# Patient Record
Sex: Female | Born: 1975 | Hispanic: Yes | Marital: Married | Smoking: Former smoker
Health system: Southern US, Community
[De-identification: ages and names within clinical notes are randomized; demographics above are authoritative.]

## PROBLEM LIST (undated history)

## (undated) DIAGNOSIS — E119 Type 2 diabetes mellitus without complications: Secondary | ICD-10-CM

## (undated) HISTORY — DX: Type 2 diabetes mellitus without complications: E11.9

## (undated) HISTORY — PX: BREAST SURGERY: SHX581

---

## 2013-02-28 ENCOUNTER — Encounter: Payer: Self-pay | Admitting: Obstetrics and Gynecology

## 2013-02-28 ENCOUNTER — Ambulatory Visit (INDEPENDENT_AMBULATORY_CARE_PROVIDER_SITE_OTHER): Payer: BC Managed Care – PPO | Admitting: Obstetrics and Gynecology

## 2013-02-28 VITALS — BP 125/84 | HR 82 | Ht 63.0 in | Wt 166.0 lb

## 2013-02-28 DIAGNOSIS — Z01419 Encounter for gynecological examination (general) (routine) without abnormal findings: Secondary | ICD-10-CM

## 2013-02-28 DIAGNOSIS — Z1151 Encounter for screening for human papillomavirus (HPV): Secondary | ICD-10-CM

## 2013-02-28 DIAGNOSIS — Z124 Encounter for screening for malignant neoplasm of cervix: Secondary | ICD-10-CM

## 2013-02-28 LAB — GLUCOSE, RANDOM: Glucose, Bld: 77 mg/dL (ref 70–99)

## 2013-02-28 LAB — LIPID PANEL: Total CHOL/HDL Ratio: 3.2 Ratio

## 2013-02-28 NOTE — Patient Instructions (Signed)
Preventive Care for Adults, Female A healthy lifestyle and preventive care can promote health and wellness. Preventive health guidelines for women include the following key practices.  A routine yearly physical is a good way to check with your caregiver about your health and preventive screening. It is a chance to share any concerns and updates on your health, and to receive a thorough exam.  Visit your dentist for a routine exam and preventive care every 6 months. Brush your teeth twice a day and floss once a day. Good oral hygiene prevents tooth decay and gum disease.  The frequency of eye exams is based on your age, health, family medical history, use of contact lenses, and other factors. Follow your caregiver's recommendations for frequency of eye exams.  Eat a healthy diet. Foods like vegetables, fruits, whole grains, low-fat dairy products, and lean protein foods contain the nutrients you need without too many calories. Decrease your intake of foods high in solid fats, added sugars, and salt. Eat the right amount of calories for you.Get information about a proper diet from your caregiver, if necessary.  Regular physical exercise is one of the most important things you can do for your health. Most adults should get at least 150 minutes of moderate-intensity exercise (any activity that increases your heart rate and causes you to sweat) each week. In addition, most adults need muscle-strengthening exercises on 2 or more days a week.  Maintain a healthy weight. The body mass index (BMI) is a screening tool to identify possible weight problems. It provides an estimate of body fat based on height and weight. Your caregiver can help determine your BMI, and can help you achieve or maintain a healthy weight.For adults 20 years and older:  A BMI below 18.5 is considered underweight.  A BMI of 18.5 to 24.9 is normal.  A BMI of 25 to 29.9 is considered overweight.  A BMI of 30 and above is  considered obese.  Maintain normal blood lipids and cholesterol levels by exercising and minimizing your intake of saturated fat. Eat a balanced diet with plenty of fruit and vegetables. Blood tests for lipids and cholesterol should begin at age 20 and be repeated every 5 years. If your lipid or cholesterol levels are high, you are over 50, or you are at high risk for heart disease, you may need your cholesterol levels checked more frequently.Ongoing high lipid and cholesterol levels should be treated with medicines if diet and exercise are not effective.  If you smoke, find out from your caregiver how to quit. If you do not use tobacco, do not start.  If you are pregnant, do not drink alcohol. If you are breastfeeding, be very cautious about drinking alcohol. If you are not pregnant and choose to drink alcohol, do not exceed 1 drink per day. One drink is considered to be 12 ounces (355 mL) of beer, 5 ounces (148 mL) of wine, or 1.5 ounces (44 mL) of liquor.  Avoid use of street drugs. Do not share needles with anyone. Ask for help if you need support or instructions about stopping the use of drugs.  High blood pressure causes heart disease and increases the risk of stroke. Your blood pressure should be checked at least every 1 to 2 years. Ongoing high blood pressure should be treated with medicines if weight loss and exercise are not effective.  If you are 55 to 36 years old, ask your caregiver if you should take aspirin to prevent strokes.  Diabetes   screening involves taking a blood sample to check your fasting blood sugar level. This should be done once every 3 years, after age 45, if you are within normal weight and without risk factors for diabetes. Testing should be considered at a younger age or be carried out more frequently if you are overweight and have at least 1 risk factor for diabetes.  Breast cancer screening is essential preventive care for women. You should practice "breast  self-awareness." This means understanding the normal appearance and feel of your breasts and may include breast self-examination. Any changes detected, no matter how small, should be reported to a caregiver. Women in their 20s and 30s should have a clinical breast exam (CBE) by a caregiver as part of a regular health exam every 1 to 3 years. After age 40, women should have a CBE every year. Starting at age 40, women should consider having a mammography (breast X-ray test) every year. Women who have a family history of breast cancer should talk to their caregiver about genetic screening. Women at a high risk of breast cancer should talk to their caregivers about having magnetic resonance imaging (MRI) and a mammography every year.  The Pap test is a screening test for cervical cancer. A Pap test can show cell changes on the cervix that might become cervical cancer if left untreated. A Pap test is a procedure in which cells are obtained and examined from the lower end of the uterus (cervix).  Women should have a Pap test starting at age 21.  Between ages 21 and 29, Pap tests should be repeated every 2 years.  Beginning at age 30, you should have a Pap test every 3 years as long as the past 3 Pap tests have been normal.  Some women have medical problems that increase the chance of getting cervical cancer. Talk to your caregiver about these problems. It is especially important to talk to your caregiver if a new problem develops soon after your last Pap test. In these cases, your caregiver may recommend more frequent screening and Pap tests.  The above recommendations are the same for women who have or have not gotten the vaccine for human papillomavirus (HPV).  If you had a hysterectomy for a problem that was not cancer or a condition that could lead to cancer, then you no longer need Pap tests. Even if you no longer need a Pap test, a regular exam is a good idea to make sure no other problems are  starting.  If you are between ages 65 and 70, and you have had normal Pap tests going back 10 years, you no longer need Pap tests. Even if you no longer need a Pap test, a regular exam is a good idea to make sure no other problems are starting.  If you have had past treatment for cervical cancer or a condition that could lead to cancer, you need Pap tests and screening for cancer for at least 20 years after your treatment.  If Pap tests have been discontinued, risk factors (such as a new sexual partner) need to be reassessed to determine if screening should be resumed.  The HPV test is an additional test that may be used for cervical cancer screening. The HPV test looks for the virus that can cause the cell changes on the cervix. The cells collected during the Pap test can be tested for HPV. The HPV test could be used to screen women aged 30 years and older, and should   be used in women of any age who have unclear Pap test results. After the age of 30, women should have HPV testing at the same frequency as a Pap test.  Colorectal cancer can be detected and often prevented. Most routine colorectal cancer screening begins at the age of 50 and continues through age 75. However, your caregiver may recommend screening at an earlier age if you have risk factors for colon cancer. On a yearly basis, your caregiver may provide home test kits to check for hidden blood in the stool. Use of a small camera at the end of a tube, to directly examine the colon (sigmoidoscopy or colonoscopy), can detect the earliest forms of colorectal cancer. Talk to your caregiver about this at age 50, when routine screening begins. Direct examination of the colon should be repeated every 5 to 10 years through age 75, unless early forms of pre-cancerous polyps or small growths are found.  Hepatitis C blood testing is recommended for all people born from 1945 through 1965 and any individual with known risks for hepatitis C.  Practice  safe sex. Use condoms and avoid high-risk sexual practices to reduce the spread of sexually transmitted infections (STIs). STIs include gonorrhea, chlamydia, syphilis, trichomonas, herpes, HPV, and human immunodeficiency virus (HIV). Herpes, HIV, and HPV are viral illnesses that have no cure. They can result in disability, cancer, and death. Sexually active women aged 25 and younger should be checked for chlamydia. Older women with new or multiple partners should also be tested for chlamydia. Testing for other STIs is recommended if you are sexually active and at increased risk.  Osteoporosis is a disease in which the bones lose minerals and strength with aging. This can result in serious bone fractures. The risk of osteoporosis can be identified using a bone density scan. Women ages 65 and over and women at risk for fractures or osteoporosis should discuss screening with their caregivers. Ask your caregiver whether you should take a calcium supplement or vitamin D to reduce the rate of osteoporosis.  Menopause can be associated with physical symptoms and risks. Hormone replacement therapy is available to decrease symptoms and risks. You should talk to your caregiver about whether hormone replacement therapy is right for you.  Use sunscreen with sun protection factor (SPF) of 30 or more. Apply sunscreen liberally and repeatedly throughout the day. You should seek shade when your shadow is shorter than you. Protect yourself by wearing long sleeves, pants, a wide-brimmed hat, and sunglasses year round, whenever you are outdoors.  Once a month, do a whole body skin exam, using a mirror to look at the skin on your back. Notify your caregiver of new moles, moles that have irregular borders, moles that are larger than a pencil eraser, or moles that have changed in shape or color.  Stay current with required immunizations.  Influenza. You need a dose every fall (or winter). The composition of the flu vaccine  changes each year, so being vaccinated once is not enough.  Pneumococcal polysaccharide. You need 1 to 2 doses if you smoke cigarettes or if you have certain chronic medical conditions. You need 1 dose at age 65 (or older) if you have never been vaccinated.  Tetanus, diphtheria, pertussis (Tdap, Td). Get 1 dose of Tdap vaccine if you are younger than age 65, are over 65 and have contact with an infant, are a healthcare worker, are pregnant, or simply want to be protected from whooping cough. After that, you need a Td   booster dose every 10 years. Consult your caregiver if you have not had at least 3 tetanus and diphtheria-containing shots sometime in your life or have a deep or dirty wound.  HPV. You need this vaccine if you are a woman age 26 or younger. The vaccine is given in 3 doses over 6 months.  Measles, mumps, rubella (MMR). You need at least 1 dose of MMR if you were born in 1957 or later. You may also need a second dose.  Meningococcal. If you are age 19 to 21 and a first-year college student living in a residence hall, or have one of several medical conditions, you need to get vaccinated against meningococcal disease. You may also need additional booster doses.  Zoster (shingles). If you are age 60 or older, you should get this vaccine.  Varicella (chickenpox). If you have never had chickenpox or you were vaccinated but received only 1 dose, talk to your caregiver to find out if you need this vaccine.  Hepatitis A. You need this vaccine if you have a specific risk factor for hepatitis A virus infection or you simply wish to be protected from this disease. The vaccine is usually given as 2 doses, 6 to 18 months apart.  Hepatitis B. You need this vaccine if you have a specific risk factor for hepatitis B virus infection or you simply wish to be protected from this disease. The vaccine is given in 3 doses, usually over 6 months. Preventive Services / Frequency Ages 19 to 39  Blood  pressure check.** / Every 1 to 2 years.  Lipid and cholesterol check.** / Every 5 years beginning at age 20.  Clinical breast exam.** / Every 3 years for women in their 20s and 30s.  Pap test.** / Every 2 years from ages 21 through 29. Every 3 years starting at age 30 through age 65 or 70 with a history of 3 consecutive normal Pap tests.  HPV screening.** / Every 3 years from ages 30 through ages 65 to 70 with a history of 3 consecutive normal Pap tests.  Hepatitis C blood test.** / For any individual with known risks for hepatitis C.  Skin self-exam. / Monthly.  Influenza immunization.** / Every year.  Pneumococcal polysaccharide immunization.** / 1 to 2 doses if you smoke cigarettes or if you have certain chronic medical conditions.  Tetanus, diphtheria, pertussis (Tdap, Td) immunization. / A one-time dose of Tdap vaccine. After that, you need a Td booster dose every 10 years.  HPV immunization. / 3 doses over 6 months, if you are 26 and younger.  Measles, mumps, rubella (MMR) immunization. / You need at least 1 dose of MMR if you were born in 1957 or later. You may also need a second dose.  Meningococcal immunization. / 1 dose if you are age 19 to 21 and a first-year college student living in a residence hall, or have one of several medical conditions, you need to get vaccinated against meningococcal disease. You may also need additional booster doses.  Varicella immunization.** / Consult your caregiver.  Hepatitis A immunization.** / Consult your caregiver. 2 doses, 6 to 18 months apart.  Hepatitis B immunization.** / Consult your caregiver. 3 doses usually over 6 months. Ages 40 to 64  Blood pressure check.** / Every 1 to 2 years.  Lipid and cholesterol check.** / Every 5 years beginning at age 20.  Clinical breast exam.** / Every year after age 40.  Mammogram.** / Every year beginning at age 40   and continuing for as long as you are in good health. Consult with your  caregiver.  Pap test.** / Every 3 years starting at age 30 through age 65 or 70 with a history of 3 consecutive normal Pap tests.  HPV screening.** / Every 3 years from ages 30 through ages 65 to 70 with a history of 3 consecutive normal Pap tests.  Fecal occult blood test (FOBT) of stool. / Every year beginning at age 50 and continuing until age 75. You may not need to do this test if you get a colonoscopy every 10 years.  Flexible sigmoidoscopy or colonoscopy.** / Every 5 years for a flexible sigmoidoscopy or every 10 years for a colonoscopy beginning at age 50 and continuing until age 75.  Hepatitis C blood test.** / For all people born from 1945 through 1965 and any individual with known risks for hepatitis C.  Skin self-exam. / Monthly.  Influenza immunization.** / Every year.  Pneumococcal polysaccharide immunization.** / 1 to 2 doses if you smoke cigarettes or if you have certain chronic medical conditions.  Tetanus, diphtheria, pertussis (Tdap, Td) immunization.** / A one-time dose of Tdap vaccine. After that, you need a Td booster dose every 10 years.  Measles, mumps, rubella (MMR) immunization. / You need at least 1 dose of MMR if you were born in 1957 or later. You may also need a second dose.  Varicella immunization.** / Consult your caregiver.  Meningococcal immunization.** / Consult your caregiver.  Hepatitis A immunization.** / Consult your caregiver. 2 doses, 6 to 18 months apart.  Hepatitis B immunization.** / Consult your caregiver. 3 doses, usually over 6 months. Ages 65 and over  Blood pressure check.** / Every 1 to 2 years.  Lipid and cholesterol check.** / Every 5 years beginning at age 20.  Clinical breast exam.** / Every year after age 40.  Mammogram.** / Every year beginning at age 40 and continuing for as long as you are in good health. Consult with your caregiver.  Pap test.** / Every 3 years starting at age 30 through age 65 or 70 with a 3  consecutive normal Pap tests. Testing can be stopped between 65 and 70 with 3 consecutive normal Pap tests and no abnormal Pap or HPV tests in the past 10 years.  HPV screening.** / Every 3 years from ages 30 through ages 65 or 70 with a history of 3 consecutive normal Pap tests. Testing can be stopped between 65 and 70 with 3 consecutive normal Pap tests and no abnormal Pap or HPV tests in the past 10 years.  Fecal occult blood test (FOBT) of stool. / Every year beginning at age 50 and continuing until age 75. You may not need to do this test if you get a colonoscopy every 10 years.  Flexible sigmoidoscopy or colonoscopy.** / Every 5 years for a flexible sigmoidoscopy or every 10 years for a colonoscopy beginning at age 50 and continuing until age 75.  Hepatitis C blood test.** / For all people born from 1945 through 1965 and any individual with known risks for hepatitis C.  Osteoporosis screening.** / A one-time screening for women ages 65 and over and women at risk for fractures or osteoporosis.  Skin self-exam. / Monthly.  Influenza immunization.** / Every year.  Pneumococcal polysaccharide immunization.** / 1 dose at age 65 (or older) if you have never been vaccinated.  Tetanus, diphtheria, pertussis (Tdap, Td) immunization. / A one-time dose of Tdap vaccine if you are over   65 and have contact with an infant, are a healthcare worker, or simply want to be protected from whooping cough. After that, you need a Td booster dose every 10 years.  Varicella immunization.** / Consult your caregiver.  Meningococcal immunization.** / Consult your caregiver.  Hepatitis A immunization.** / Consult your caregiver. 2 doses, 6 to 18 months apart.  Hepatitis B immunization.** / Check with your caregiver. 3 doses, usually over 6 months. ** Family history and personal history of risk and conditions may change your caregiver's recommendations. Document Released: 06/13/2001 Document Revised: 07/10/2011  Document Reviewed: 09/12/2010 [employer] Patient Information 2014 [employer].  

## 2013-02-28 NOTE — Progress Notes (Signed)
  Subjective:     Deborah Alexander is a 37 y.o. female with LMP 02/13/2013 and BMI 29 who is here for a comprehensive physical exam. The patient reports no problems. She is sexually active using rhythm method for contraception. She would welcome a pregnancy if it occurred. Patient reports normal monthly menstrual cycles  History   Social History  . Marital Status: Married    Spouse Name: N/A    Number of Children: N/A  . Years of Education: N/A   Occupational History  . Not on file.   Social History Main Topics  . Smoking status: Not on file  . Smokeless tobacco: Not on file  . Alcohol Use: Not on file  . Drug Use: Not on file  . Sexual Activity: Not on file   Other Topics Concern  . Not on file   Social History Narrative  . No narrative on file   No health maintenance topics applied.  History reviewed. No pertinent past medical history. Past Surgical History  Procedure Laterality Date  . Breast surgery  1997-1998   Family History  Problem Relation Age of Onset  . Hypertension Mother   . Heart disease Mother   . Hypertension Father   . Diabetes Father   . Heart disease Father    History   Social History  . Marital Status: Married    Spouse Name: N/A    Number of Children: N/A  . Years of Education: N/A   Occupational History  . Not on file.   Social History Main Topics  . Smoking status: Current Every Day Smoker  . Smokeless tobacco: Never Used  . Alcohol Use: Yes     Comment: weekends  . Drug Use: No  . Sexual Activity: Yes    Partners: Male    Birth Control/ Protection: None   Other Topics Concern  . Not on file   Social History Narrative  . No narrative on file     Review of Systems A comprehensive review of systems was negative.   Objective:      GENERAL: Well-developed, well-nourished female in no acute distress.  HEENT: Normocephalic, atraumatic. Sclerae anicteric.  NECK: Supple. Normal thyroid.  LUNGS: Clear to auscultation  bilaterally.  HEART: Regular rate and rhythm. BREASTS: Symmetric in size. No palpable masses or lymphadenopathy, skin changes, or nipple drainage. ABDOMEN: Soft, nontender, nondistended. No organomegaly. PELVIC: Normal external female genitalia. Vagina is pink and rugated.  Normal discharge. Normal appearing cervix. Uterus is normal in size. No adnexal mass or tenderness. EXTREMITIES: No cyanosis, clubbing, or edema, 2+ distal pulses.    Assessment:    Healthy female exam.      Plan:    Pap smear collected Patient advised to take PNV in the event that a pregnancy occurs Patient advised to perform monthly breast exam Fasting labs collected today See After Visit Summary for Counseling Recommendations

## 2013-03-01 LAB — TSH: TSH: 0.802 u[IU]/mL (ref 0.350–4.500)

## 2014-03-02 ENCOUNTER — Ambulatory Visit (INDEPENDENT_AMBULATORY_CARE_PROVIDER_SITE_OTHER): Payer: BC Managed Care – PPO | Admitting: Obstetrics & Gynecology

## 2014-03-02 ENCOUNTER — Encounter: Payer: Self-pay | Admitting: Obstetrics & Gynecology

## 2014-03-02 VITALS — BP 121/84 | HR 93 | Ht 63.0 in | Wt 184.6 lb

## 2014-03-02 DIAGNOSIS — Z01419 Encounter for gynecological examination (general) (routine) without abnormal findings: Secondary | ICD-10-CM

## 2014-03-02 DIAGNOSIS — Z1151 Encounter for screening for human papillomavirus (HPV): Secondary | ICD-10-CM

## 2014-03-02 DIAGNOSIS — Z124 Encounter for screening for malignant neoplasm of cervix: Secondary | ICD-10-CM | POA: Diagnosis not present

## 2014-03-02 NOTE — Patient Instructions (Signed)
Pap Test A Pap test is a procedure done in a clinic office to evaluate cells that are on the surface of the cervix. The cervix is the lower portion of the uterus and upper portion of the vagina. For some women, the cervical region has the potential to form cancer. With consistent evaluations by your caregiver, this type of cancer can be prevented.  If a Pap test is abnormal, it is most often a result of a previous exposure to human papillomavirus (HPV). HPV is a virus that can infect the cells of the cervix and cause dysplasia. Dysplasia is where the cells no longer look normal. If a woman has been diagnosed with high-grade or severe dysplasia, they are at higher risk of developing cervical cancer. People diagnosed with low-grade dysplasia should still be seen by their caregiver because there is a small chance that low-grade dysplasia could develop into cancer.  LET YOUR CAREGIVER KNOW ABOUT:  Recent sexually transmitted infection (STI) you have had.  Any new sex partners you have had.  History of previous abnormal Pap tests results.  History of previous cervical procedures you have had (colposcopy, biopsy, loop electrosurgical excision procedure [LEEP]).  Concerns you have had regarding unusual vaginal discharge.  History of pelvic pain.  Your use of birth control. BEFORE THE PROCEDURE  Ask your caregiver when to schedule your Pap test. It is best not to be on your period if your caregiver uses a wooden spatula to collect cells or applies cells to a glass slide. Newer techniques are not so sensitive to the timing of a menstrual cycle.  Do not douche or have sexual intercourse for 24 hours before the test.   Do not use vaginal creams or tampons for 24 hours before the test.   Empty your bladder just before the test to lessen any discomfort.  PROCEDURE You will lie on an exam table with your feet in stirrups. A warm metal or plastic instrument (speculum) is placed in your vagina. This  instrument allows your caregiver to see the inside of your vagina and look at your cervix. A small, plastic brush or wooden spatula is then used to collect cervical cells. These cells are placed in a lab specimen container. The cells are looked at under a microscope. A specialist will determine if the cells are normal.  AFTER THE PROCEDURE Make sure to get your test results.If your results come back abnormal, you may need further testing.  Document Released: 07/08/2002 Document Revised: 07/10/2011 Document Reviewed: 04/13/2011 [employer] Patient Information 2015 [employer]. This information is not intended to replace advice given to you by your health care provider. Make sure you discuss any questions you have with your health care provider.  

## 2014-03-02 NOTE — Progress Notes (Signed)
Patient ID: Deborah Alexander, female   DOB: January 05, 1976, 38 y.o.   MRN: [id number] Subjective:     Deborah Alexander is a 38 y.o. female here for a routine exam.  Current complaints: none.   Gynecologic History Patient's last menstrual period was 02/17/2014 (exact date). Contraception: none Last Pap: 2014. Results were: normal Last mammogram: n/a.   History reviewed. No pertinent past medical history. Past Surgical History  Procedure Laterality Date  . Breast surgery  1997-1998   No current outpatient prescriptions on file prior to visit.   No current facility-administered medications on file prior to visit.   No Known Allergies    Obstetric History OB History  Gravida Para Term Preterm AB SAB TAB Ectopic Multiple Living  2    2 1 1        # Outcome Date GA Lbr Len/2nd Weight Sex Delivery Anes PTL Lv  2 SAB           1 TAB                The following portions of the patient's history were reviewed and updated as appropriate: allergies, current medications, past family history, past medical history, past social history, past surgical history and problem list.  Review of Systems A comprehensive review of systems was negative.    Objective:    BP 121/84 mmHg  Pulse 93  Ht 5\' 3"  (1.6 m)  Wt 184 lb 9.6 oz (83.734 kg)  BMI 32.71 kg/m2  LMP 02/17/2014 (Exact Date)  General Appearance:    Alert, cooperative, no distress, appears stated age              Throat:   Lips, mucosa, and tongue normal; teeth and gums normal  Neck:   Supple, symmetrical, trachea midline, no adenopathy;    thyroid:  no enlargement/tenderness/nodules; no carotid   bruit or JVD  Back:     Symmetric, no curvature, ROM normal, no CVA tenderness  Lungs:     Clear to auscultation bilaterally, respirations unlabored  Chest Wall:    No tenderness or deformity   Heart:    Regular rate and rhythm, S1 and S2 normal, no murmur, rub   or gallop  Breast Exam:    No tenderness, masses, or nipple abnormality  Abdomen:      Soft, non-tender, bowel sounds active all four quadrants,    no masses, no organomegaly  Genitalia:    Normal female without lesion, discharge or tenderness     Extremities:   Extremities normal, atraumatic, no cyanosis or edema                  Assessment:    Healthy female exam.    Plan:    Follow up in: 1 year.    F/u PAP with HPV Labs: CBC and HgbA1C

## 2014-03-03 ENCOUNTER — Telehealth: Payer: Self-pay | Admitting: *Deleted

## 2014-03-03 LAB — HEMOGLOBIN A1C
Hgb A1c MFr Bld: 5.9 % — ABNORMAL HIGH (ref <5.7)
Mean Plasma Glucose: 123 mg/dL — ABNORMAL HIGH (ref <117)

## 2014-03-03 LAB — CBC
HCT: 38.9 % (ref 36.0–46.0)
Hemoglobin: 13.5 g/dL (ref 12.0–15.0)
MCH: 29.7 pg (ref 26.0–34.0)
MCHC: 34.7 g/dL (ref 30.0–36.0)
MCV: 85.7 fL (ref 78.0–100.0)
Platelets: 338 10*3/uL (ref 150–400)
RBC: 4.54 MIL/uL (ref 3.87–5.11)
RDW: 14.1 % (ref 11.5–15.5)
WBC: 6.3 10*3/uL (ref 4.0–10.5)

## 2014-03-03 NOTE — Telephone Encounter (Signed)
Left message for patient to call us back regarding her lab results.  Patient had requested we fax a copy of her results to her Primary Care Physician.  Dr. Cato MulliganAshany Sundaan.  Phone number is [phone].

## 2014-03-03 NOTE — Telephone Encounter (Signed)
-----   Message from Willodean Rosenthalarolyn Harraway-Smith, MD sent at 03/03/2014 10:36 AM EST ----- Please call pt. Her labs show that she is PREdiabetic, at increased risk of DM.  This result should be forwarded to her primary care provider and please encourage her to f/u with them for further eval and management.  Thx, clh-S

## 2014-03-04 LAB — CYTOLOGY - PAP

## 2014-03-05 ENCOUNTER — Telehealth: Payer: Self-pay | Admitting: *Deleted

## 2014-03-05 NOTE — Telephone Encounter (Signed)
-----   Message from Carolyn Harraway-Smith, MD sent at 03/03/2014 10:36 AM EST ----- Please call pt. Her labs show that she is PREdiabetic, at increased risk of DM.  This result should be forwarded to her primary care provider and please encourage her to f/u with them for further eval and management.  Thx, clh-S    

## 2014-03-05 NOTE — Telephone Encounter (Signed)
Notified patient of results and recommendations.

## 2014-10-14 ENCOUNTER — Ambulatory Visit (INDEPENDENT_AMBULATORY_CARE_PROVIDER_SITE_OTHER): Payer: Federal, State, Local not specified - PPO | Admitting: Physician Assistant

## 2014-10-14 ENCOUNTER — Encounter: Payer: Self-pay | Admitting: Physician Assistant

## 2014-10-14 VITALS — BP 134/85 | HR 105 | Ht 63.0 in | Wt 185.0 lb

## 2014-10-14 DIAGNOSIS — R319 Hematuria, unspecified: Secondary | ICD-10-CM

## 2014-10-14 DIAGNOSIS — N939 Abnormal uterine and vaginal bleeding, unspecified: Secondary | ICD-10-CM | POA: Diagnosis not present

## 2014-10-14 DIAGNOSIS — Z720 Tobacco use: Secondary | ICD-10-CM | POA: Insufficient documentation

## 2014-10-14 NOTE — Progress Notes (Signed)
Patient ID: Deborah Alexander, female   DOB: July 01, 1975, 39 y.o.   MRN: [id number] History:  Deborah Alexander is a 39 y.o. G2P0020 who presents to clinic today for irregular period.  She last had menses May 28 and then started bleeding again 3 days ago.  She went to [facility] and was told she had urine infection and given rx for abx.  She is unsure if she should use.  She does not use any hormones or contraception.  She denies abdominal pain, dysuria,  Increased urinary frequency, back pain, hesitancy.    The following portions of the patient's history were reviewed and updated as appropriate: allergies, current medications, past family history, past medical history, past social history, past surgical history and problem list.  Review of Systems:  Pertinent ROS in HPI.  All other systems are negative.     Objective:  Physical Exam BP 134/85 mmHg  Pulse 105  Ht 5\' 3"  (1.6 m)  Wt 185 lb (83.915 kg)  BMI 32.78 kg/m2  LMP 09/26/2014 (Exact Date) GENERAL: Well-developed, well-nourished female in no acute distress.  HEENT: Normocephalic, atraumatic.  NECK: Supple. Normal thyroid.  LUNGS: Normal rate. Clear to auscultation bilaterally.  HEART: Regular rate and rhythm with no adventitious sounds.  BREASTS: Symmetric in size. No masses, skin changes, nipple drainage, or lymphadenopathy. ABDOMEN: Soft, nontender, nondistended. No organomegaly. Normal bowel sounds appreciated in all quadrants.  PELVIC: Normal external female genitalia. Vagina is pink and rugated.   Normal cervix contour. Mod amt of bleeding.   Uterus is normal in size. No adnexal mass or tenderness.  EXTREMITIES: No cyanosis, clubbing, or edema, 2+ distal pulses.   Labs and Imaging No results found.   Assessment & Plan:  Assessment: 1. Hematuria   2. Abnormal uterine bleeding (AUB)      Plans: Pt declines hormonal therapy.  Pt unable to give urine sample to eval for possible UTI vs hematuria as treated by [facility] for annual exam or  sooner prn  Bertram Denver, PA-C 10/14/2014 4:36 PM

## 2014-10-14 NOTE — Patient Instructions (Signed)

## 2015-02-26 ENCOUNTER — Encounter: Payer: Self-pay | Admitting: Obstetrics & Gynecology

## 2015-02-26 ENCOUNTER — Ambulatory Visit (INDEPENDENT_AMBULATORY_CARE_PROVIDER_SITE_OTHER): Payer: Federal, State, Local not specified - PPO | Admitting: Obstetrics & Gynecology

## 2015-02-26 VITALS — BP 131/86 | HR 86 | Wt 174.0 lb

## 2015-02-26 DIAGNOSIS — Z833 Family history of diabetes mellitus: Secondary | ICD-10-CM

## 2015-02-26 DIAGNOSIS — Z3481 Encounter for supervision of other normal pregnancy, first trimester: Secondary | ICD-10-CM | POA: Diagnosis not present

## 2015-02-26 DIAGNOSIS — N898 Other specified noninflammatory disorders of vagina: Secondary | ICD-10-CM

## 2015-02-26 DIAGNOSIS — O09511 Supervision of elderly primigravida, first trimester: Secondary | ICD-10-CM

## 2015-02-26 DIAGNOSIS — Z3491 Encounter for supervision of normal pregnancy, unspecified, first trimester: Secondary | ICD-10-CM

## 2015-02-26 DIAGNOSIS — O09519 Supervision of elderly primigravida, unspecified trimester: Secondary | ICD-10-CM | POA: Insufficient documentation

## 2015-02-26 DIAGNOSIS — O0990 Supervision of high risk pregnancy, unspecified, unspecified trimester: Secondary | ICD-10-CM | POA: Insufficient documentation

## 2015-02-26 NOTE — Progress Notes (Signed)
   Subjective:    Deborah Alexander is a 39 y.o. G2P0010 [id number] being seen today for her first obstetrical visit.  Her obstetrical history is significant for advanced maternal age and FH of diabetes.  Patient does intend to breast feed. Pregnancy history fully reviewed.  Patient reports nausea and vomiting, but able to tolerate oral intake. Also worried about vaginal discharge.  Filed Vitals:   02/26/15 0913  BP: 131/86  Pulse: 86  Weight: 174 lb (78.926 kg)    HISTORY: OB History  Gravida Para Term Preterm AB SAB TAB Ectopic Multiple Living  2    1 0 1       # Outcome Date GA Lbr Len/2nd Weight Sex Delivery Anes PTL Lv  2 Current           1 TAB              History reviewed. No pertinent past medical history. Past Surgical History  Procedure Laterality Date  . Breast surgery  1997-1998   Family History  Problem Relation Age of Onset  . Hypertension Mother   . Heart disease Mother   . Hypertension Father   . Diabetes Father   . Heart disease Father      Exam    Uterus:     Pelvic Exam:    Perineum: No Hemorrhoids, Normal Perineum   Vulva: normal   Vagina:  normal mucosa, white and thick discharge, wet prep done   Cervix: no bleeding following Pap, no cervical motion tenderness, no lesions and nulliparous appearance   Adnexa: normal adnexa and no mass, fullness, tenderness   Bony Pelvis: average  System: Breast:  normal appearance, no masses or tenderness, Normal to palpation without dominant masses   Skin: normal coloration and turgor, no rashes   Neurologic: oriented, normal, negative, normal mood   Extremities: normal strength, tone, and muscle mass, ROM of all joints is normal   HEENT PERRLA, extra ocular movement intact and sclera clear, anicteric   Mouth/Teeth mucous membranes moist, pharynx normal without lesions and dental hygiene good   Neck supple and no masses   Cardiovascular: regular rate and rhythm   Respiratory:  appears well, vitals normal, no  respiratory distress, acyanotic, normal RR, chest clear, no wheezing, crepitations, rhonchi, normal symmetric air entry   Abdomen: soft, non-tender; bowel sounds normal; no masses,  no organomegaly   Urinary: urethral meatus normal   Clinic scan: Viable SIUP ~ [redacted] weeks GA   Assessment:    Pregnancy: G2P0010 Patient Active Problem List   Diagnosis Date Noted  . Supervision of normal pregnancy 02/26/2015  . Advanced maternal age, antepartum 02/26/2015        Plan:   Initial labs including early 1 hr GTT drawn. Continue prenatal vitamins. Problem list reviewed and updated. Genetic Screening discussed First Screen: ordered.  Also interested in NIPS, noted on MFM referral order.  Ultrasound discussed; fetal survey: to be ordered later. The nature [facility] with multiple MDs and other Advanced Practice Providers was explained to patient; also emphasized that residents, students are part of our team.  Follow up in 4 weeks. Routine obstetric precautions reviewed.    Tereso NewcomerANYANWU,Zyquan Crotty A, MD 02/26/2015

## 2015-02-26 NOTE — Patient Instructions (Signed)
 First Trimester of Pregnancy The first trimester of pregnancy is from week 1 until the end of week 12 (months 1 through 3). A week after a sperm fertilizes an egg, the egg will implant on the wall of the uterus. This embryo will begin to develop into a baby. Genes from you and your partner are forming the baby. The female genes determine whether the baby is a boy or a girl. At 6-8 weeks, the eyes and face are formed, and the heartbeat can be seen on ultrasound. At the end of 12 weeks, all the baby's organs are formed.  Now that you are pregnant, you will want to do everything you can to have a healthy baby. Two of the most important things are to get good prenatal care and to follow your health care provider's instructions. Prenatal care is all the medical care you receive before the baby's birth. This care will help prevent, find, and treat any problems during the pregnancy and childbirth. BODY CHANGES Your body goes through many changes during pregnancy. The changes vary from woman to woman.   You may gain or lose a couple of pounds at first.  You may feel sick to your stomach (nauseous) and throw up (vomit). If the vomiting is uncontrollable, call your health care provider.  You may tire easily.  You may develop headaches that can be relieved by medicines approved by your health care provider.  You may urinate more often. Painful urination may mean you have a bladder infection.  You may develop heartburn as a result of your pregnancy.  You may develop constipation because certain hormones are causing the muscles that push waste through your intestines to slow down.  You may develop hemorrhoids or swollen, bulging veins (varicose veins).  Your breasts may begin to grow larger and become tender. Your nipples may stick out more, and the tissue that surrounds them (areola) may become darker.  Your gums may bleed and may be sensitive to brushing and flossing.  Dark spots or blotches  (chloasma, mask of pregnancy) may develop on your face. This will likely fade after the baby is born.  Your menstrual periods will stop.  You may have a loss of appetite.  You may develop cravings for certain kinds of food.  You may have changes in your emotions from day to day, such as being excited to be pregnant or being concerned that something may go wrong with the pregnancy and baby.  You may have more vivid and strange dreams.  You may have changes in your hair. These can include thickening of your hair, rapid growth, and changes in texture. Some women also have hair loss during or after pregnancy, or hair that feels dry or thin. Your hair will most likely return to normal after your baby is born. WHAT TO EXPECT AT YOUR PRENATAL VISITS During a routine prenatal visit:  You will be weighed to make sure you and the baby are growing normally.  Your blood pressure will be taken.  Your abdomen will be measured to track your baby's growth.  The fetal heartbeat will be listened to starting around week 10 or 12 of your pregnancy.  Test results from any previous visits will be discussed. Your health care provider may ask you:  How you are feeling.  If you are feeling the baby move.  If you have had any abnormal symptoms, such as leaking fluid, bleeding, severe headaches, or abdominal cramping.  If you are using any tobacco   products, including cigarettes, chewing tobacco, and electronic cigarettes.  If you have any questions. Other tests that may be performed during your first trimester include:  Blood tests to find your blood type and to check for the presence of any previous infections. They will also be used to check for low iron levels (anemia) and Rh antibodies. Later in the pregnancy, blood tests for diabetes will be done along with other tests if problems develop.  Urine tests to check for infections, diabetes, or protein in the urine.  An ultrasound to confirm the  proper growth and development of the baby.  An amniocentesis to check for possible genetic problems.  Fetal screens for spina bifida and Down syndrome.  You may need other tests to make sure you and the baby are doing well.  HIV (human immunodeficiency virus) testing. Routine prenatal testing includes screening for HIV, unless you choose not to have this test. HOME CARE INSTRUCTIONS  Medicines  Follow your health care provider's instructions regarding medicine use. Specific medicines may be either safe or unsafe to take during pregnancy.  Take your prenatal vitamins as directed.  If you develop constipation, try taking a stool softener if your health care provider approves. Diet  Eat regular, well-balanced meals. Choose a variety of foods, such as meat or vegetable-based protein, fish, milk and low-fat dairy products, vegetables, fruits, and whole grain breads and cereals. Your health care provider will help you determine the amount of weight gain that is right for you.  Avoid raw meat and uncooked cheese. These carry germs that can cause birth defects in the baby.  Eating four or five small meals rather than three large meals a day may help relieve nausea and vomiting. If you start to feel nauseous, eating a few soda crackers can be helpful. Drinking liquids between meals instead of during meals also seems to help nausea and vomiting.  If you develop constipation, eat more high-fiber foods, such as fresh vegetables or fruit and whole grains. Drink enough fluids to keep your urine clear or pale yellow. Activity and Exercise  Exercise only as directed by your health care provider. Exercising will help you:  Control your weight.  Stay in shape.  Be prepared for labor and delivery.  Experiencing pain or cramping in the lower abdomen or low back is a good sign that you should stop exercising. Check with your health care provider before continuing normal exercises.  Try to avoid  standing for long periods of time. Move your legs often if you must stand in one place for a long time.  Avoid heavy lifting.  Wear low-heeled shoes, and practice good posture.  You may continue to have sex unless your health care provider directs you otherwise. Relief of Pain or Discomfort  Wear a good support bra for breast tenderness.   Take warm sitz baths to soothe any pain or discomfort caused by hemorrhoids. Use hemorrhoid cream if your health care provider approves.   Rest with your legs elevated if you have leg cramps or low back pain.  If you develop varicose veins in your legs, wear support hose. Elevate your feet for 15 minutes, 3-4 times a day. Limit salt in your diet. Prenatal Care  Schedule your prenatal visits by the twelfth week of pregnancy. They are usually scheduled monthly at first, then more often in the last 2 months before delivery.  Write down your questions. Take them to your prenatal visits.  Keep all your prenatal visits as directed by   your health care provider. Safety  Wear your seat belt at all times when driving.  Make a list of emergency phone numbers, including numbers for family, friends, the hospital, and police and fire departments. General Tips  Ask your health care provider for a referral to a local prenatal education class. Begin classes no later than at the beginning of month 6 of your pregnancy.  Ask for help if you have counseling or nutritional needs during pregnancy. Your health care provider can offer advice or refer you to specialists for help with various needs.  Do not use hot tubs, steam rooms, or saunas.  Do not douche or use tampons or scented sanitary pads.  Do not cross your legs for long periods of time.  Avoid cat litter boxes and soil used by cats. These carry germs that can cause birth defects in the baby and possibly loss of the fetus by miscarriage or stillbirth.  Avoid all smoking, herbs, alcohol, and medicines  not prescribed by your health care provider. Chemicals in these affect the formation and growth of the baby.  Do not use any tobacco products, including cigarettes, chewing tobacco, and electronic cigarettes. If you need help quitting, ask your health care provider. You may receive counseling support and other resources to help you quit.  Schedule a dentist appointment. At home, brush your teeth with a soft toothbrush and be gentle when you floss. SEEK MEDICAL CARE IF:   You have dizziness.  You have mild pelvic cramps, pelvic pressure, or nagging pain in the abdominal area.  You have persistent nausea, vomiting, or diarrhea.  You have a bad smelling vaginal discharge.  You have pain with urination.  You notice increased swelling in your face, hands, legs, or ankles. SEEK IMMEDIATE MEDICAL CARE IF:   You have a fever.  You are leaking fluid from your vagina.  You have spotting or bleeding from your vagina.  You have severe abdominal cramping or pain.  You have rapid weight gain or loss.  You vomit blood or material that looks like coffee grounds.  You are exposed to German measles and have never had them.  You are exposed to fifth disease or chickenpox.  You develop a severe headache.  You have shortness of breath.  You have any kind of trauma, such as from a fall or a car accident.   This information is not intended to replace advice given to you by your health care provider. Make sure you discuss any questions you have with your health care provider.   Document Released: 04/11/2001 Document Revised: 05/08/2014 Document Reviewed: 02/25/2013 Elsevier Interactive Patient Education 2016 [employer]   Breastfeeding Deciding to breastfeed is one of the best choices you can make for you and your baby. A change in hormones during pregnancy causes your breast tissue to grow and increases the number and size of your milk ducts. These hormones also allow proteins, sugars,  and fats from your blood supply to make breast milk in your milk-producing glands. Hormones prevent breast milk from being released before your baby is born as well as prompt milk flow after birth. Once breastfeeding has begun, thoughts of your baby, as well as his or her sucking or crying, can stimulate the release of milk from your milk-producing glands.  BENEFITS OF BREASTFEEDING For Your Baby  Your first milk (colostrum) helps your baby's digestive system function better.  There are antibodies in your milk that help your baby fight off infections.  Your baby has   a lower incidence of asthma, allergies, and sudden infant death syndrome.  The nutrients in breast milk are better for your baby than infant formulas and are designed uniquely for your baby's needs.  Breast milk improves your baby's brain development.  Your baby is less likely to develop other conditions, such as childhood obesity, asthma, or type 2 diabetes mellitus. For You  Breastfeeding helps to create a very special bond between you and your baby.  Breastfeeding is convenient. Breast milk is always available at the correct temperature and costs nothing.  Breastfeeding helps to burn calories and helps you lose the weight gained during pregnancy.  Breastfeeding makes your uterus contract to its prepregnancy size faster and slows bleeding (lochia) after you give birth.   Breastfeeding helps to lower your risk of developing type 2 diabetes mellitus, osteoporosis, and breast or ovarian cancer later in life. SIGNS THAT YOUR BABY IS HUNGRY Early Signs of Hunger  Increased alertness or activity.  Stretching.  Movement of the head from side to side.  Movement of the head and opening of the mouth when the corner of the mouth or cheek is stroked (rooting).  Increased sucking sounds, smacking lips, cooing, sighing, or squeaking.  Hand-to-mouth movements.  Increased sucking of fingers or hands. Late Signs of  Hunger  Fussing.  Intermittent crying. Extreme Signs of Hunger Signs of extreme hunger will require calming and consoling before your baby will be able to breastfeed successfully. Do not wait for the following signs of extreme hunger to occur before you initiate breastfeeding:  Restlessness.  A loud, strong cry.  Screaming. BREASTFEEDING BASICS Breastfeeding Initiation  Find a comfortable place to sit or lie down, with your neck and back well supported.  Place a pillow or rolled up blanket under your baby to bring him or her to the level of your breast (if you are seated). Nursing pillows are specially designed to help support your arms and your baby while you breastfeed.  Make sure that your baby's abdomen is facing your abdomen.  Gently massage your breast. With your fingertips, massage from your chest wall toward your nipple in a circular motion. This encourages milk flow. You may need to continue this action during the feeding if your milk flows slowly.  Support your breast with 4 fingers underneath and your thumb above your nipple. Make sure your fingers are well away from your nipple and your baby's mouth.  Stroke your baby's lips gently with your finger or nipple.  When your baby's mouth is open wide enough, quickly bring your baby to your breast, placing your entire nipple and as much of the colored area around your nipple (areola) as possible into your baby's mouth.  More areola should be visible above your baby's upper lip than below the lower lip.  Your baby's tongue should be between his or her lower gum and your breast.  Ensure that your baby's mouth is correctly positioned around your nipple (latched). Your baby's lips should create a seal on your breast and be turned out (everted).  It is common for your baby to suck about 2-3 minutes in order to start the flow of breast milk. Latching Teaching your baby how to latch on to your breast properly is very important.  An improper latch can cause nipple pain and decreased milk supply for you and poor weight gain in your baby. Also, if your baby is not latched onto your nipple properly, he or she may swallow some air during feeding. This   can make your baby fussy. Burping your baby when you switch breasts during the feeding can help to get rid of the air. However, teaching your baby to latch on properly is still the best way to prevent fussiness from swallowing air while breastfeeding. Signs that your baby has successfully latched on to your nipple:  Silent tugging or silent sucking, without causing you pain.  Swallowing heard between every 3-4 sucks.  Muscle movement above and in front of his or her ears while sucking. Signs that your baby has not successfully latched on to nipple:  Sucking sounds or smacking sounds from your baby while breastfeeding.  Nipple pain. If you think your baby has not latched on correctly, slip your finger into the corner of your baby's mouth to break the suction and place it between your baby's gums. Attempt breastfeeding initiation again. Signs of Successful Breastfeeding Signs from your baby:  A gradual decrease in the number of sucks or complete cessation of sucking.  Falling asleep.  Relaxation of his or her body.  Retention of a small amount of milk in his or her mouth.  Letting go of your breast by himself or herself. Signs from you:  Breasts that have increased in firmness, weight, and size 1-3 hours after feeding.  Breasts that are softer immediately after breastfeeding.  Increased milk volume, as well as a change in milk consistency and color by the fifth day of breastfeeding.  Nipples that are not sore, cracked, or bleeding. Signs That Your Baby is Getting Enough Milk  Wetting at least 3 diapers in a 24-hour period. The urine should be clear and pale yellow by age 5 days.  At least 3 stools in a 24-hour period by age 5 days. The stool should be soft and  yellow.  At least 3 stools in a 24-hour period by age 7 days. The stool should be seedy and yellow.  No loss of weight greater than 10% of birth weight during the first 3 days of age.  Average weight gain of 4-7 ounces (113-198 g) per week after age 4 days.  Consistent daily weight gain by age 5 days, without weight loss after the age of 2 weeks. After a feeding, your baby may spit up a small amount. This is common. BREASTFEEDING FREQUENCY AND DURATION Frequent feeding will help you make more milk and can prevent sore nipples and breast engorgement. Breastfeed when you feel the need to reduce the fullness of your breasts or when your baby shows signs of hunger. This is called "breastfeeding on demand." Avoid introducing a pacifier to your baby while you are working to establish breastfeeding (the first 4-6 weeks after your baby is born). After this time you may choose to use a pacifier. Research has shown that pacifier use during the first year of a baby's life decreases the risk of sudden infant death syndrome (SIDS). Allow your baby to feed on each breast as long as he or she wants. Breastfeed until your baby is finished feeding. When your baby unlatches or falls asleep while feeding from the first breast, offer the second breast. Because newborns are often sleepy in the first few weeks of life, you may need to awaken your baby to get him or her to feed. Breastfeeding times will vary from baby to baby. However, the following rules can serve as a guide to help you ensure that your baby is properly fed:  Newborns (babies 4 weeks of age or younger) may breastfeed every 1-3 hours.    Newborns should not go longer than 3 hours during the day or 5 hours during the night without breastfeeding.  You should breastfeed your baby a minimum of 8 times in a 24-hour period until you begin to introduce solid foods to your baby at around 6 months of age. BREAST MILK PUMPING Pumping and storing breast milk  allows you to ensure that your baby is exclusively fed your breast milk, even at times when you are unable to breastfeed. This is especially important if you are going back to work while you are still breastfeeding or when you are not able to be present during feedings. Your lactation consultant can give you guidelines on how long it is safe to store breast milk. A breast pump is a machine that allows you to pump milk from your breast into a sterile bottle. The pumped breast milk can then be stored in a refrigerator or freezer. Some breast pumps are operated by hand, while others use electricity. Ask your lactation consultant which type will work best for you. Breast pumps can be purchased, but some hospitals and breastfeeding support groups lease breast pumps on a monthly basis. A lactation consultant can teach you how to hand express breast milk, if you prefer not to use a pump. CARING FOR YOUR BREASTS WHILE YOU BREASTFEED Nipples can become dry, cracked, and sore while breastfeeding. The following recommendations can help keep your breasts moisturized and healthy:  Avoid using soap on your nipples.  Wear a supportive bra. Although not required, special nursing bras and tank tops are designed to allow access to your breasts for breastfeeding without taking off your entire bra or top. Avoid wearing underwire-style bras or extremely tight bras.  Air dry your nipples for 3-4minutes after each feeding.  Use only cotton bra pads to absorb leaked breast milk. Leaking of breast milk between feedings is normal.  Use lanolin on your nipples after breastfeeding. Lanolin helps to maintain your skin's normal moisture barrier. If you use pure lanolin, you do not need to wash it off before feeding your baby again. Pure lanolin is not toxic to your baby. You may also hand express a few drops of breast milk and gently massage that milk into your nipples and allow the milk to air dry. In the first few weeks after  giving birth, some women experience extremely full breasts (engorgement). Engorgement can make your breasts feel heavy, warm, and tender to the touch. Engorgement peaks within 3-5 days after you give birth. The following recommendations can help ease engorgement:  Completely empty your breasts while breastfeeding or pumping. You may want to start by applying warm, moist heat (in the shower or with warm water-soaked hand towels) just before feeding or pumping. This increases circulation and helps the milk flow. If your baby does not completely empty your breasts while breastfeeding, pump any extra milk after he or she is finished.  Wear a snug bra (nursing or regular) or tank top for 1-2 days to signal your body to slightly decrease milk production.  Apply ice packs to your breasts, unless this is too uncomfortable for you.  Make sure that your baby is latched on and positioned properly while breastfeeding. If engorgement persists after 48 hours of following these recommendations, contact your health care provider or a lactation consultant. OVERALL HEALTH CARE RECOMMENDATIONS WHILE BREASTFEEDING  Eat healthy foods. Alternate between meals and snacks, eating 3 of each per day. Because what you eat affects your breast milk, some of the foods   may make your baby more irritable than usual. Avoid eating these foods if you are sure that they are negatively affecting your baby.  Drink milk, fruit juice, and water to satisfy your thirst (about 10 glasses a day).  Rest often, relax, and continue to take your prenatal vitamins to prevent fatigue, stress, and anemia.  Continue breast self-awareness checks.  Avoid chewing and smoking tobacco. Chemicals from cigarettes that pass into breast milk and exposure to secondhand smoke may harm your baby.  Avoid alcohol and drug use, including marijuana. Some medicines that may be harmful to your baby can pass through breast milk. It is important to ask your health  care provider before taking any medicine, including all over-the-counter and prescription medicine as well as vitamin and herbal supplements. It is possible to become pregnant while breastfeeding. If birth control is desired, ask your health care provider about options that will be safe for your baby. SEEK MEDICAL CARE IF:  You feel like you want to stop breastfeeding or have become frustrated with breastfeeding.  You have painful breasts or nipples.  Your nipples are cracked or bleeding.  Your breasts are red, tender, or warm.  You have a swollen area on either breast.  You have a fever or chills.  You have nausea or vomiting.  You have drainage other than breast milk from your nipples.  Your breasts do not become full before feedings by the fifth day after you give birth.  You feel sad and depressed.  Your baby is too sleepy to eat well.  Your baby is having trouble sleeping.   Your baby is wetting less than 3 diapers in a 24-hour period.  Your baby has less than 3 stools in a 24-hour period.  Your baby's skin or the white part of his or her eyes becomes yellow.   Your baby is not gaining weight by 5 days of age. SEEK IMMEDIATE MEDICAL CARE IF:  Your baby is overly tired (lethargic) and does not want to wake up and feed.  Your baby develops an unexplained fever.   This information is not intended to replace advice given to you by your health care provider. Make sure you discuss any questions you have with your health care provider.   Document Released: 04/17/2005 Document Revised: 01/06/2015 Document Reviewed: 10/09/2012 Elsevier Interactive Patient Education 2016 [employer]  

## 2015-02-26 NOTE — Progress Notes (Signed)
Bedside ultrasound today measures [id number] fetus with heartbeat.

## 2015-02-27 LAB — WET PREP, GENITAL
Trich, Wet Prep: NONE SEEN
Yeast Wet Prep HPF POC: NONE SEEN

## 2015-02-27 LAB — GLUCOSE TOLERANCE, 1 HOUR (50G) W/O FASTING: GLUCOSE 1 HOUR GTT: 207 mg/dL — AB (ref 70–140)

## 2015-02-28 LAB — CULTURE, OB URINE

## 2015-03-01 ENCOUNTER — Telehealth: Payer: Self-pay | Admitting: *Deleted

## 2015-03-01 ENCOUNTER — Encounter: Payer: Self-pay | Admitting: Obstetrics & Gynecology

## 2015-03-01 DIAGNOSIS — O24319 Unspecified pre-existing diabetes mellitus in pregnancy, unspecified trimester: Secondary | ICD-10-CM | POA: Insufficient documentation

## 2015-03-01 DIAGNOSIS — O24419 Gestational diabetes mellitus in pregnancy, unspecified control: Secondary | ICD-10-CM

## 2015-03-01 LAB — PRENATAL PROFILE (SOLSTAS)
ANTIBODY SCREEN: NEGATIVE
BASOS ABS: 0 10*3/uL (ref 0.0–0.1)
BASOS PCT: 1 % (ref 0–1)
EOS ABS: 0.1 10*3/uL (ref 0.0–0.7)
Eosinophils Relative: 2 % (ref 0–5)
HCT: 38.8 % (ref 36.0–46.0)
HEMOGLOBIN: 13.3 g/dL (ref 12.0–15.0)
HIV 1&2 Ab, 4th Generation: NONREACTIVE
Hepatitis B Surface Ag: NEGATIVE
Lymphocytes Relative: 46 % (ref 12–46)
Lymphs Abs: 1.9 10*3/uL (ref 0.7–4.0)
MCH: 29.8 pg (ref 26.0–34.0)
MCHC: 34.3 g/dL (ref 30.0–36.0)
MCV: 86.8 fL (ref 78.0–100.0)
MPV: 9.4 fL (ref 8.6–12.4)
Monocytes Absolute: 0.2 10*3/uL (ref 0.1–1.0)
Monocytes Relative: 5 % (ref 3–12)
NEUTROS PCT: 46 % (ref 43–77)
Neutro Abs: 1.9 10*3/uL (ref 1.7–7.7)
Platelets: 303 10*3/uL (ref 150–400)
RBC: 4.47 MIL/uL (ref 3.87–5.11)
RDW: 13.6 % (ref 11.5–15.5)
Rh Type: POSITIVE
Rubella: 15 Index — ABNORMAL HIGH (ref <0.90)
WBC: 4.1 10*3/uL (ref 4.0–10.5)

## 2015-03-01 LAB — GC/CHLAMYDIA PROBE AMP (~~LOC~~) NOT AT ARMC
CHLAMYDIA, DNA PROBE: NEGATIVE
NEISSERIA GONORRHEA: NEGATIVE

## 2015-03-01 NOTE — Telephone Encounter (Signed)
-----   Message from Tereso NewcomerUgonna A Anyanwu, MD sent at 03/01/2015 10:49 AM EDT ----- Patient's 1 hr GTT is 207 -> diagnostic of preexisting diabetes.  Needs to be set up for diabetic education and supplies. Needs follow up OB visit one week after she gets testing supplies.

## 2015-03-01 NOTE — Telephone Encounter (Signed)
Called pt, no answer, left message for pt to call office.  

## 2015-03-01 NOTE — Telephone Encounter (Signed)
-----   Message from Ugonna A Anyanwu, MD sent at 03/01/2015 10:49 AM EDT ----- Patient's 1 hr GTT is 207 -> diagnostic of preexisting diabetes.  Needs to be set up for diabetic education and supplies. Needs follow up OB visit one week after she gets testing supplies. 

## 2015-03-01 NOTE — Telephone Encounter (Signed)
Informed pt of results, will send referral to diabetes educator to set up appt. Once appt is made with diabetes educator we will change OB appt based on appt date and time.

## 2015-03-03 ENCOUNTER — Encounter: Payer: Federal, State, Local not specified - PPO | Attending: Obstetrics & Gynecology

## 2015-03-03 VITALS — Ht 63.0 in | Wt 172.1 lb

## 2015-03-03 DIAGNOSIS — R7302 Impaired glucose tolerance (oral): Secondary | ICD-10-CM | POA: Diagnosis present

## 2015-03-03 DIAGNOSIS — Z713 Dietary counseling and surveillance: Secondary | ICD-10-CM | POA: Diagnosis not present

## 2015-03-03 DIAGNOSIS — R7309 Other abnormal glucose: Secondary | ICD-10-CM

## 2015-03-03 LAB — CYSTIC FIBROSIS DIAGNOSTIC STUDY

## 2015-03-03 NOTE — Progress Notes (Signed)
  Patient was seen on 03/03/15 for Gestational Diabetes self-management class at the [facility]. The following learning objectives were met by the patient during this course:   States the definition of Gestational Diabetes  States why dietary management is important in controlling blood glucose  Describes the effects each nutrient has on blood glucose levels  Demonstrates ability to create a balanced meal plan  Demonstrates carbohydrate counting   States when to check blood glucose levels  Demonstrates proper blood glucose monitoring techniques  States the effect of stress and exercise on blood glucose levels  States the importance of limiting caffeine and abstaining from alcohol and smoking  Blood glucose monitor given:  One Nurse, learning disability Kit Lot # [id number] X Exp: 03/2016 Blood glucose reading: 113 mg/dl  Patient instructed to monitor glucose levels: FBS: 60 - <90 2 hour: <120  *Patient received handouts:  Nutrition Diabetes and Pregnancy  Carbohydrate Counting List  Patient will be seen for follow-up as needed.

## 2015-03-04 ENCOUNTER — Telehealth: Payer: Self-pay | Admitting: *Deleted

## 2015-03-04 DIAGNOSIS — O24410 Gestational diabetes mellitus in pregnancy, diet controlled: Secondary | ICD-10-CM

## 2015-03-04 MED ORDER — ACCU-CHEK FASTCLIX LANCETS MISC: 1.0000 [IU] | Freq: Four times a day (QID) | Status: DC

## 2015-03-04 MED ORDER — GLUCOSE BLOOD VI STRP: ORAL_STRIP | Status: DC

## 2015-03-04 NOTE — Telephone Encounter (Signed)
Sent rx for lancets and test strips to pharmacy.  Informed pt.

## 2015-03-04 NOTE — Telephone Encounter (Signed)
-----   Message from Olevia BowensJacinda S Battle sent at 03/04/2015  8:15 AM EDT ----- Regarding: [facility] RX Contact: [phone] Patient attended a diabetes nutrition class on 11/2 that we referred her to, they gave her a glucose meter, she was told to contact us for a rx for lancets and strips. Sh e was given One Nurse, children'sTouch Verio Flex [place] [facility] [place]

## 2015-03-08 ENCOUNTER — Encounter: Payer: Self-pay | Admitting: *Deleted

## 2015-03-08 ENCOUNTER — Telehealth: Payer: Self-pay | Admitting: *Deleted

## 2015-03-08 MED ORDER — ONETOUCH ULTRASOFT LANCETS MISC: Status: DC

## 2015-03-08 NOTE — Telephone Encounter (Signed)
Received staff message the wrong lancet brand was sent to pharmacy, [facility]

## 2015-03-24 ENCOUNTER — Ambulatory Visit (INDEPENDENT_AMBULATORY_CARE_PROVIDER_SITE_OTHER): Payer: Federal, State, Local not specified - PPO | Admitting: Obstetrics & Gynecology

## 2015-03-24 ENCOUNTER — Encounter: Payer: Self-pay | Admitting: Obstetrics & Gynecology

## 2015-03-24 ENCOUNTER — Encounter: Payer: Self-pay | Admitting: *Deleted

## 2015-03-24 VITALS — BP 114/76 | HR 96 | Wt 172.0 lb

## 2015-03-24 DIAGNOSIS — O24911 Unspecified diabetes mellitus in pregnancy, first trimester: Secondary | ICD-10-CM | POA: Diagnosis not present

## 2015-03-24 DIAGNOSIS — O24311 Unspecified pre-existing diabetes mellitus in pregnancy, first trimester: Secondary | ICD-10-CM

## 2015-03-24 DIAGNOSIS — O0991 Supervision of high risk pregnancy, unspecified, first trimester: Secondary | ICD-10-CM

## 2015-03-24 LAB — CBC
HEMATOCRIT: 36.8 % (ref 36.0–46.0)
HEMOGLOBIN: 12.3 g/dL (ref 12.0–15.0)
MCH: 28.8 pg (ref 26.0–34.0)
MCHC: 33.4 g/dL (ref 30.0–36.0)
MCV: 86.2 fL (ref 78.0–100.0)
MPV: 10 fL (ref 8.6–12.4)
Platelets: 302 10*3/uL (ref 150–400)
RBC: 4.27 MIL/uL (ref 3.87–5.11)
RDW: 13.8 % (ref 11.5–15.5)
WBC: 3.9 10*3/uL — AB (ref 4.0–10.5)

## 2015-03-24 LAB — COMPREHENSIVE METABOLIC PANEL
ALT: 35 U/L — ABNORMAL HIGH (ref 6–29)
AST: 22 U/L (ref 10–30)
Albumin: 3.5 g/dL — ABNORMAL LOW (ref 3.6–5.1)
Alkaline Phosphatase: 40 U/L (ref 33–115)
BUN: 6 mg/dL — AB (ref 7–25)
CHLORIDE: 102 mmol/L (ref 98–110)
CO2: 22 mmol/L (ref 20–31)
Calcium: 8.9 mg/dL (ref 8.6–10.2)
Creat: 0.5 mg/dL (ref 0.50–1.10)
GLUCOSE: 114 mg/dL — AB (ref 65–99)
POTASSIUM: 3.7 mmol/L (ref 3.5–5.3)
Sodium: 136 mmol/L (ref 135–146)
Total Bilirubin: 0.3 mg/dL (ref 0.2–1.2)
Total Protein: 6.6 g/dL (ref 6.1–8.1)

## 2015-03-24 LAB — TSH: TSH: 0.409 u[IU]/mL (ref 0.350–4.500)

## 2015-03-24 LAB — HEMOGLOBIN A1C
HEMOGLOBIN A1C: 5.7 % — AB (ref <5.7)
Mean Plasma Glucose: 117 mg/dL — ABNORMAL HIGH (ref <117)

## 2015-03-24 MED ORDER — ASPIRIN EC 81 MG PO TBEC: 81.0000 mg | DELAYED_RELEASE_TABLET | Freq: Every day | ORAL | Status: DC

## 2015-03-24 MED ORDER — GLYBURIDE 2.5 MG PO TABS: 1.2500 mg | ORAL_TABLET | Freq: Two times a day (BID) | ORAL | Status: DC

## 2015-03-24 NOTE — Progress Notes (Signed)
Subjective:  Deborah Alexander is a 39 y.o. G2P0010 at 669w6d being seen today for ongoing prenatal care.  She is currently monitored for the following issues for this high-risk pregnancy: Patient Active Problem List   Diagnosis Date Noted  . Preexisting diabetes complicating pregnancy, antepartum 03/01/2015  . Supervision of high-risk pregnancy 02/26/2015  . Advanced maternal age, antepartum 02/26/2015   Patient reports heartburn alleviated by Zantac.  Contractions: Not present. Vag. Bleeding: None.  Movement: Absent. Denies leaking of fluid.   The following portions of the patient's history were reviewed and updated as appropriate: allergies, current medications, past family history, past medical history, past social history, past surgical history and problem list. Problem list updated.  Objective:   Filed Vitals:   03/24/15 0818  BP: 114/76  Pulse: 96  Weight: 172 lb (78.019 kg)    Fetal Status: Fetal Heart Rate (bpm): 148   Movement: Absent     General:  Alert, oriented and cooperative. Patient is in no acute distress.  Skin: Skin is warm and dry. No rash noted.   Cardiovascular: Normal heart rate noted  Respiratory: Normal respiratory effort, no problems with respiration noted  Abdomen: Soft, gravid, appropriate for gestational age. Pain/Pressure: Absent     Pelvic: Vag. Bleeding: None    Cervical exam deferred        Extremities: Normal range of motion.  Edema: None  Mental Status: Normal mood and affect. Normal behavior. Normal judgment and thought content.   Urinalysis: Urine Protein: Negative Urine Glucose: Negative    Assessment and Plan:  Pregnancy: G2P0010 at 6169w6d  1. Preexisting diabetes complicating pregnancy, antepartum, first trimester 1 hr GTT at 9 weeks was 207.  Went through the following checklist [x]  Baseline labs (CBC, CMP, urine Pr:Cr,TSH, HgA1C) [ ]  Aspirin 81 mg daily prescribed after 12 weeks [ ]  Fetal ECHO - ordered today [ ]  Eye exam -  ordered today [ ]  Serial growth scans 20-24-28-32-35-38 (20 week scan ordered today) [ ]  Antenatal testing starting at 32 weeks [ ]  Delivery by 39 weeks or earlier if needed Discussed implications of DM in pregnancy, need for optimizing glycemic control to decrease DM associated maternal-fetal morbidity and mortality, need for antenatal testing and frequent ultrasounds/prenatal visits. Will check baseline labs today, get DM education and DM testing supplies today. Reviewed blood sugars from log above, prescribed Glyburide.  Will reevaluate at next visit. Hypoglycemia precautions given. Orders placed - aspirin EC 81 MG tablet; Take 1 tablet (81 mg total) by mouth daily. Take after 12 weeks for prevention of preeclampsia later in pregnancy  Dispense: 300 tablet; Refill: 2 - CBC - Comprehensive metabolic panel - Hemoglobin A1c - Protein / creatinine ratio, urine - TSH - glyBURIDE (DIABETA) 2.5 MG tablet; Take 0.5 tablets (1.25 mg total) by mouth 2 (two) times daily with a meal. Can increase to 1 tablet twice a day for better glycemic control  Dispense: 60 tablet; Refill: 3 - Ambulatory referral to Ophthalmology - US Fetal Echocardiography; Future - US OB DETAIL + 14 WK; Future  Patient told she can continue Zantac for GERD. Routine obstetric precautions reviewed. Please refer to After Visit Summary for other counseling recommendations.  Return in about 2 weeks (around 04/07/2015) for OB visit.   Tereso NewcomerUgonna A Anyanwu, MD

## 2015-03-24 NOTE — Patient Instructions (Signed)
Type 1 or Type 2 Diabetes Mellitus During Pregnancy Diabetes mellitus, often simply referred to as diabetes, is a long-term (chronic) disease. Type 1 diabetes occurs when the islet cells, which are in the pancreas and make the hormone insulin, are destroyed and can no longer make insulin. Type 2 diabetes occurs when the pancreas does not make enough insulin, the cells are less responsive to the insulin that is made (insulin resistance), or both. Insulin is needed to move sugars from food into the tissue cells. The tissue cells use the sugars for energy. The lack of insulin or the lack of normal response to insulin causes excess sugars to build up in the blood instead of going into the tissue cells. As a result, high blood sugar (hyperglycemia) develops.  If blood glucose levels are kept in the normal range both before and during pregnancy, women can have a healthy pregnancy. If your blood glucose levels are not well controlled, there may be risks to you, your unborn baby, and your labor and delivery. Also, there may be risks to your baby once he or she is born.  RISK FACTORS  You are predisposed to developing type 1 diabetes if someone in your family has diabetes and you are exposed to certain environmental triggers.  You have an increased chance of developing type 2 diabetes if you have a family history of diabetes and also have one or more of the following risk factors:  Being overweight.  Having an inactive lifestyle.  Having a history of consistently eating high-calorie foods. SYMPTOMS  Increased thirst (polydipsia).  Increased urination (polyuria).  Increased urination during the night (nocturia).  Weight loss. This weight loss may be rapid.  Frequent, recurring infections.  Tiredness (fatigue).  Weakness.  Vision changes, such as blurred vision.  Fruity smell to your breath.  Abdominal pain.  Nausea or vomiting. DIAGNOSIS  If you have risk factors for diabetes, you may  be screened for undiagnosed type 2 diabetes at your first prenatal visit. If you have previously given birth and you had gestational diabetes, you should be screened. The screening should be performed 6-12 weeks after the child is born and repeated every 1-3 years after the first test. Diabetes is diagnosed when blood glucose levels are increased. Your blood glucose level may be checked by one or more of the following blood tests:  A fasting blood glucose test. You will not be allowed to eat for at least 8 hours before a blood sample is taken.  A random blood glucose test. Your blood glucose is checked at any time of the day regardless of when you ate.  A hemoglobin A1c blood glucose test. A hemoglobin A1c test provides information about blood glucose control over the previous 3 months.  An oral glucose tolerance test (OGTT). Your blood glucose is measured after you have not eaten (fasted) for 1-3 hours and then after you drink a glucose-containing beverage. An OGTT is usually performed during weeks 24-28 of your pregnancy. TREATMENT   You will need to take diabetes medicine or insulin daily to keep blood glucose levels in the desired range.  You will need to match insulin dosing with exercise and healthy food choices. If you have type 1 or type 2 diabetes, your treatment goal is to maintain the following blood glucose levels during pregnancy:  Before meals (preprandial), at bedtime, and overnight: 60-99 mg/dL.  After meals (postprandial): peak of 100-129 mg/dL.  A1c: less than 7%. HOME CARE INSTRUCTIONS   Have your hemoglobin   A1c level checked twice a year.  Perform daily blood glucose monitoring as directed by your health care provider. It is common to perform frequent blood glucose monitoring.  Monitor urine ketones when you are sick and as directed by your health care provider.  Take your diabetes medicine and insulin as directed by your health care provider to maintain your  blood glucose level in the desired range.  Never run out of diabetes medicine or insulin. It is needed every day.  Adjust insulin based on your intake of carbohydrates. Carbohydrates can raise blood glucose levels but need to be included in your diet. Carbohydrates provide vitamins, minerals, and fiber, which are an essential part of a healthy diet. Carbohydrates are found in fruits, vegetables, whole grains, dairy products, legumes, and foods containing added sugars.  Eat healthy foods. Alternate 3 meals with 3 snacks.  Maintain a healthy weight gain. The usual total expected weight gain varies according to your prepregnancy body mass index (BMI).  Carry a medical alert card or wear medical alert jewelry.  Carry a 15-gram carbohydrate snack with you at all times to treat low blood sugar (hypoglycemia). Some examples of 15-gram carbohydrate snacks include:  Glucose tablets, 3 or 4.  Glucose gel, 15-gram tube.  Raisins, 2 Tbsp (24 grams).  Jelly beans, 6.  Animal crackers, 8.  Fruit juice, regular soda, or low-fat milk, 4 ounces (120 mL).  Gummy treats, 9.  Recognize hypoglycemia. Hypoglycemia during pregnancy occurs with blood glucose levels of 60 mg/dL and below. The risk for hypoglycemia increases when fasting or skipping meals, during or after intense exercise, and during sleep. Hypoglycemia symptoms can include:  Tremors or shakes.  Decreased ability to concentrate.  Sweating.  Increased heart rate.  Headache.  Dry mouth.  Hunger.  Irritability.  Anxiety.  Restless sleep.  Altered speech or coordination.  Confusion.  Treat hypoglycemia promptly. If you are alert and able to safely swallow, follow the 15:15 rule:  Take 15-20 grams of rapid-acting glucose or carbohydrate. Rapid-acting options include glucose gel, glucose tablets, or 4 ounces (120 mL) of fruit juice, regular soda, or low-fat milk.  Check your blood glucose level 15 minutes after taking the  glucose.  Take an additional 15-20 grams of glucose if the repeat blood glucose level is still 70 mg/dL or below.  Eat a meal or snack within 1 hour once blood glucose levels return to normal.  Engage in at least 30 minutes of physical activity a day or as directed by your health care provider. Ten minutes of physical activity timed 30 minutes after each meal is encouraged to control postprandial blood glucose levels.  Watch for polyuria (excess urination) and polydipsia (feeling extra thirsty), which are early signs of hyperglycemia. An early awareness of hyperglycemia allows for prompt treatment. Treat hyperglycemia as directed by your health care provider.  Adjust your insulin dosing and food intake, as needed, if you start a new exercise or sport.  Follow your sick-day plan any time you are unable to eat or drink as usual.  Avoid tobacco and alcohol use.  Keep all follow-up visits as directed by your health care provider.  Follow the advice of your health care provider regarding your prenatal and post-delivery (postpartum) appointments, meal planning, exercise, medicines, vitamins, blood tests, other medical tests, and physical activities.  Continue daily skin and foot care. Examine your skin and feet daily for cuts, bruises, redness, nail problems, bleeding, blisters, or sores. A foot exam by a health care provider should   be done annually.  Brush your teeth and gums at least twice a day and floss at least once a day. Follow up with your dentist regularly.  Schedule an eye exam during the first trimester of your pregnancy or as directed by your health care provider.  Share your diabetes management plan with your workplace or school.  Stay up-to-date with immunizations.  Learn to manage stress.  Obtain ongoing diabetes education and support as needed.  Your health care provider may recommend that you take one low-dose aspirin (81 mg) each day to help prevent high blood pressure  during your pregnancy (preeclampsia or eclampsia). You may be at risk for preeclampsia or eclampsia if:  You had preeclampsia or eclampsia during a previous pregnancy.  Your baby did not grow as expected during a previous pregnancy.  You experienced preterm birth with a previous pregnancy.  You experienced a separation of the placenta from the uterus (placental abruption) during a previous pregnancy.  You experienced the loss of your baby during a previous pregnancy.  You are pregnant with more than one baby.  You have other medical conditions, such as high blood pressure or autoimmune disease. SEEK MEDICAL CARE IF:   You are unable to eat food or drink fluids for more than 6 hours.  You have nausea and vomiting for more than 6 hours.  You have a blood glucose level of 200 mg/dL and you have ketones in your urine.  There is a change in mental status.  You develop vision problems.  You have a persistent headache.  You have upper abdominal pain or discomfort.  You have an additional serious sickness.  You have diarrhea for more than 6 hours.  You have been sick or have had a fever for 2 days and are not getting better. SEEK IMMEDIATE MEDICAL CARE IF:  You have difficulty breathing.  You no longer feel your baby moving.  You are bleeding or have discharge from your vagina.  You start having premature contractions or labor. MAKE SURE YOU:  Understand these instructions.  Will watch your condition.  Will get help right away if you are not doing well or get worse.   This information is not intended to replace advice given to you by your health care provider. Make sure you discuss any questions you have with your health care provider.   Document Released: 01/10/2012 Document Revised: 05/08/2014 Document Reviewed: 01/10/2012 Elsevier Interactive Patient Education 2016 [employer]  

## 2015-03-24 NOTE — Addendum Note (Signed)
Addended by: Jaynie CollinsANYANWU, Pepper Kerrick A on: 03/24/2015 09:31 AM   Modules accepted: Orders

## 2015-03-25 LAB — PROTEIN / CREATININE RATIO, URINE
Creatinine, Urine: 57 mg/dL (ref 20–320)
PROTEIN CREATININE RATIO: 140 mg/g{creat} (ref 21–161)
Total Protein, Urine: 8 mg/dL (ref 5–24)

## 2015-03-26 ENCOUNTER — Ambulatory Visit (HOSPITAL_COMMUNITY)
Admission: RE | Admit: 2015-03-26 | Discharge: 2015-03-26 | Disposition: A | Discharge status: Home / Self Care | Payer: Federal, State, Local not specified - PPO | Source: Ambulatory Visit | Attending: Obstetrics & Gynecology | Admitting: Obstetrics & Gynecology

## 2015-03-26 DIAGNOSIS — Z3491 Encounter for supervision of normal pregnancy, unspecified, first trimester: Secondary | ICD-10-CM

## 2015-03-26 DIAGNOSIS — O09511 Supervision of elderly primigravida, first trimester: Secondary | ICD-10-CM

## 2015-03-26 DIAGNOSIS — O24312 Unspecified pre-existing diabetes mellitus in pregnancy, second trimester: Secondary | ICD-10-CM

## 2015-03-26 DIAGNOSIS — O24111 Pre-existing diabetes mellitus, type 2, in pregnancy, first trimester: Secondary | ICD-10-CM | POA: Diagnosis not present

## 2015-03-26 DIAGNOSIS — Z315 Encounter for genetic counseling: Secondary | ICD-10-CM | POA: Insufficient documentation

## 2015-03-26 DIAGNOSIS — Z3A13 13 weeks gestation of pregnancy: Secondary | ICD-10-CM | POA: Insufficient documentation

## 2015-03-26 DIAGNOSIS — O09512 Supervision of elderly primigravida, second trimester: Secondary | ICD-10-CM

## 2015-03-26 DIAGNOSIS — O09521 Supervision of elderly multigravida, first trimester: Secondary | ICD-10-CM | POA: Diagnosis not present

## 2015-03-26 DIAGNOSIS — Z3689 Encounter for other specified antenatal screening: Secondary | ICD-10-CM

## 2015-03-26 NOTE — Progress Notes (Signed)
Genetic Counseling  High-Risk Gestation Note  Appointment Date:  03/26/2015 Referred By: Tereso Newcomer, MD Date of Birth:  Mar 25, 1976 Partner: Lynelle Smoke   Pregnancy History: [id number] Estimated Date of Delivery: 09/30/15 Estimated Gestational Age: [redacted]w[redacted]d Attending: Alpha Gula, MD   [name] and her partner, Mr. Darel Hong Walworth, were seen for genetic counseling because of a maternal age of 39 y.o..  She will be 39 years old at delivery.   In Summary:   Reviewed approximate 1 in 58 risk for fetal aneuploidy related to maternal age of 39 years old at delivery  Nuchal translucency ultrasound performed today and within normal range  Patient elected to pursue NIPS (Panorama) today; declined invasive testing  Detailed ultrasound scheduled for 04/30/15  Family history significant for congenital heart disease for a sibling for each Ms. Pree and Mr. Maia Plan; See detailed discussion below.   They were counseled regarding maternal age and the association with risk for chromosome conditions due to nondisjunction with aging of the ova.   We reviewed chromosomes, nondisjunction, and the associated 1 in 89 risk for fetal aneuploidy at [redacted]w[redacted]d gestation related to a maternal age of 39 years old at delivery. They were counseled that the risk for aneuploidy decreases as gestational age increases, accounting for those pregnancies which spontaneously abort.  We specifically discussed Down syndrome (trisomy 29), trisomies 15 and 54, and sex chromosome aneuploidies (47,XXX and 47,XXY) including the common features and prognoses of each.   We reviewed available screening options including First Screen, Quad screen, noninvasive prenatal screening (NIPS)/cell free DNA (cfDNA) testing, and detailed ultrasound.  They were counseled that screening tests are used to modify a patient's a priori risk for aneuploidy, typically based on age. This estimate provides a pregnancy specific  risk assessment. We reviewed the benefits and limitations of each option. Specifically, we discussed the conditions for which each test screens, the detection rates, and false positive rates of each. They were also counseled regarding diagnostic testing via CVS and amniocentesis. We reviewed the approximate 1 in 100 risk for complications for CVS and the approximate 1 in 300-500 risk for complications for amniocentesis, including spontaneous pregnancy loss. After consideration of all the options, she elected to proceed with NIPS (Panorama through [facility] laboratory).  Those results will be available in 8-10 days.  They were counseled regarding the expense of NIPS, and that approximately $3000 is billed to the insurance provider.  The amount she will be responsible for, out of pocket, depends upon the  specific plan and is subject to co-pay, co-insurance and/or deductible.    A nuchal translucency ultrasound was performed today.  The report will be documented separately.  Detailed ultrasound is scheduled for 04/30/15. They understand that screening tests cannot rule out all birth defects or genetic syndromes. The patient was advised of this limitation and states she still does not want additional testing at this time.   [name] was provided with written information regarding cystic fibrosis (CF) including the carrier frequency and incidence in the Hispanic population, the availability of carrier testing and prenatal diagnosis if indicated.  In addition, we discussed that CF is routinely screened for as part of the [place] newborn screening panel.  She declined CF testing today.   Both family histories were reviewed and found to be contributory for a hole in the heart for one of the patient's sister and for one of the father of the pregnancy's brothers. Both individuals are reportedly otherwise healthy and did not require  surgical correction. Congenital heart defects occur in approximately 1% of  pregnancies.  Congenital heart defects may occur due to multifactorial influences, chromosomal abnormalities, genetic syndromes or environmental exposures.  Isolated heart defects are generally multifactorial.  Given the reported family history and assuming multifactorial inheritance, the risk for a congenital heart defect in the current pregnancy (a second degree relative to both individuals) is approximately 2%. We discussed the option of detailed ultrasound to assess fetal anatomy in detail. Without further information regarding the provided family history, an accurate genetic risk cannot be calculated. Further genetic counseling is warranted if more information is obtained.  Deborah Alexander denied exposure to environmental toxins or chemical agents. She denied the use of tobacco or street drugs. She reported drinking two glasses of champagne on 03/25/15.  Prenatal alcohol exposure can increase the risk for growth delays, small head size, heart defects, eye and facial differences, as well as behavior problems and learning disabilities. The risk of these to occur tends to increase with the amount of alcohol consumed. However, because there is no identified safe amount of alcohol in pregnancy, it is recommended to completely avoid alcohol in pregnancy. Given the reported amount of exposure, risk for associated effects are likely low in the current pregnancy. She denied significant viral illnesses during the course of her pregnancy. Her medical and surgical histories were contributory for diabetes, which is currently treated with glyburide and managed by her OB.   I counseled this couple regarding the above risks and available options.  The approximate face-to-face time with the genetic counselor was 40 minutes.  Quinn PlowmanKaren Belma Dyches, MS,  Certified Genetic Counselor 03/26/2015

## 2015-04-05 ENCOUNTER — Telehealth (HOSPITAL_COMMUNITY): Payer: Self-pay | Admitting: MS"

## 2015-04-05 NOTE — Telephone Encounter (Signed)
Called Deborah Alexander to discuss her prenatal cell free DNA test results.  Mrs. Deborah Alexander had Panorama testing through [facility].  Testing was offered because of advanced maternal age.   The patient was identified by name and DOB.  We reviewed that these are within normal limits, showing a less than 1 in 10,000 risk for trisomies 21, 18 and 13, and monosomy X (Turner syndrome).  In addition, the risk for triploidy/vanishing twin and sex chromosome trisomies (47,XXX and 47,XXY) was also low risk.  We reviewed that this testing identifies > 99% of pregnancies with trisomy 6221, trisomy 6613, sex chromosome trisomies (47,XXX and 47,XXY), and triploidy. The detection rate for trisomy 18 is 96%.  The detection rate for monosomy X is ~92%.  The false positive rate is <0.1% for all conditions. Testing was also consistent with female fetal sex.  The patient did wish to know fetal sex.  She understands that this testing does not identify all genetic conditions.  All questions were answered to her satisfaction, she was encouraged to call with additional questions or concerns.  Quinn PlowmanKaren Urbano Milhouse, MS Certified Genetic Counselor 04/05/2015 11:57 AM

## 2015-04-05 NOTE — Telephone Encounter (Signed)
Left message for patient to return call.   Clydie BraunKaren Haile Toppins 04/05/2015 11:45 AM

## 2015-04-07 ENCOUNTER — Ambulatory Visit (INDEPENDENT_AMBULATORY_CARE_PROVIDER_SITE_OTHER): Payer: Federal, State, Local not specified - PPO | Admitting: Certified Nurse Midwife

## 2015-04-07 VITALS — BP 126/81 | HR 96 | Wt 176.0 lb

## 2015-04-07 DIAGNOSIS — O09522 Supervision of elderly multigravida, second trimester: Secondary | ICD-10-CM

## 2015-04-07 NOTE — Progress Notes (Signed)
Subjective:  Deborah Alexander is a 39 y.o. G2P0010 at 3754w6d being seen today for ongoing prenatal care.  She is currently monitored for the following issues for this high-risk pregnancy and has Supervision of high-risk pregnancy; Advanced maternal age, antepartum; and Preexisting diabetes complicating pregnancy, antepartum on her problem list.  Patient reports no complaints.  Contractions: Not present. Vag. Bleeding: None.  Movement: Absent. Denies leaking of fluid.   The following portions of the patient's history were reviewed and updated as appropriate: allergies, current medications, past family history, past medical history, past social history, past surgical history and problem list. Problem list updated.  Objective:   Filed Vitals:   04/07/15 1605  BP: 126/81  Pulse: 96  Weight: 176 lb (79.833 kg)    Fetal Status: Fetal Heart Rate (bpm): 150   Movement: Absent     General:  Alert, oriented and cooperative. Patient is in no acute distress.  Skin: Skin is warm and dry. No rash noted.   Cardiovascular: Normal heart rate noted  Respiratory: Normal respiratory effort, no problems with respiration noted  Abdomen: Soft, gravid, appropriate for gestational age. Pain/Pressure: Absent     Pelvic: Vag. Bleeding: None Vag D/C Character: Thin   Cervical exam deferred        Extremities: Normal range of motion.  Edema: None  Mental Status: Normal mood and affect. Normal behavior. Normal judgment and thought content.   Urinalysis: Urine Protein: Negative Urine Glucose: Negative  Assessment and Plan:  Pregnancy: G2P0010 at 7154w6d  There are no diagnoses linked to this encounter. Preterm labor symptoms and general obstetric precautions including but not limited to vaginal bleeding, contractions, leaking of fluid and fetal movement were reviewed in detail with the patient. Please refer to After Visit Summary for other counseling recommendations.  Return in about 4 weeks (around  05/05/2015).   Rhea PinkLori A Clemmons, CNM

## 2015-04-08 ENCOUNTER — Other Ambulatory Visit (HOSPITAL_COMMUNITY): Payer: Self-pay

## 2015-04-30 ENCOUNTER — Other Ambulatory Visit (HOSPITAL_COMMUNITY): Payer: Self-pay | Admitting: Maternal and Fetal Medicine

## 2015-04-30 ENCOUNTER — Encounter (HOSPITAL_COMMUNITY): Payer: Self-pay

## 2015-04-30 ENCOUNTER — Ambulatory Visit (HOSPITAL_COMMUNITY)
Admission: RE | Admit: 2015-04-30 | Discharge: 2015-04-30 | Disposition: A | Discharge status: Home / Self Care | Payer: Federal, State, Local not specified - PPO | Source: Ambulatory Visit | Attending: Obstetrics & Gynecology | Admitting: Obstetrics & Gynecology

## 2015-04-30 DIAGNOSIS — O24112 Pre-existing diabetes mellitus, type 2, in pregnancy, second trimester: Secondary | ICD-10-CM | POA: Insufficient documentation

## 2015-04-30 DIAGNOSIS — Z36 Encounter for antenatal screening of mother: Secondary | ICD-10-CM | POA: Insufficient documentation

## 2015-04-30 DIAGNOSIS — Z3689 Encounter for other specified antenatal screening: Secondary | ICD-10-CM

## 2015-04-30 DIAGNOSIS — O09512 Supervision of elderly primigravida, second trimester: Secondary | ICD-10-CM

## 2015-04-30 DIAGNOSIS — O24312 Unspecified pre-existing diabetes mellitus in pregnancy, second trimester: Secondary | ICD-10-CM

## 2015-04-30 DIAGNOSIS — Z3A18 18 weeks gestation of pregnancy: Secondary | ICD-10-CM

## 2015-04-30 DIAGNOSIS — O09522 Supervision of elderly multigravida, second trimester: Secondary | ICD-10-CM

## 2015-05-03 ENCOUNTER — Encounter: Payer: Self-pay | Admitting: *Deleted

## 2015-05-06 ENCOUNTER — Ambulatory Visit (INDEPENDENT_AMBULATORY_CARE_PROVIDER_SITE_OTHER): Payer: Federal, State, Local not specified - PPO | Admitting: Obstetrics and Gynecology

## 2015-05-06 ENCOUNTER — Encounter: Payer: Self-pay | Admitting: Obstetrics and Gynecology

## 2015-05-06 ENCOUNTER — Ambulatory Visit (HOSPITAL_COMMUNITY): Payer: Federal, State, Local not specified - PPO

## 2015-05-06 VITALS — BP 125/84 | HR 97 | Wt 181.0 lb

## 2015-05-06 DIAGNOSIS — O4400 Placenta previa specified as without hemorrhage, unspecified trimester: Secondary | ICD-10-CM | POA: Insufficient documentation

## 2015-05-06 DIAGNOSIS — O0992 Supervision of high risk pregnancy, unspecified, second trimester: Secondary | ICD-10-CM

## 2015-05-06 DIAGNOSIS — O24312 Unspecified pre-existing diabetes mellitus in pregnancy, second trimester: Secondary | ICD-10-CM

## 2015-05-06 DIAGNOSIS — Z36 Encounter for antenatal screening of mother: Secondary | ICD-10-CM

## 2015-05-06 DIAGNOSIS — O24912 Unspecified diabetes mellitus in pregnancy, second trimester: Secondary | ICD-10-CM

## 2015-05-06 DIAGNOSIS — O09512 Supervision of elderly primigravida, second trimester: Secondary | ICD-10-CM

## 2015-05-06 DIAGNOSIS — O4412 Placenta previa with hemorrhage, second trimester: Secondary | ICD-10-CM

## 2015-05-06 DIAGNOSIS — O4402 Placenta previa specified as without hemorrhage, second trimester: Secondary | ICD-10-CM

## 2015-05-06 NOTE — Progress Notes (Signed)
Subjective:  Deborah Alexander is a 40 y.o. G2P0010 at 615w0d being seen today for ongoing prenatal care.  She is currently monitored for the following issues for this high-risk pregnancy and has Supervision of high-risk pregnancy; Advanced maternal age, antepartum; Preexisting diabetes complicating pregnancy, antepartum; and Placenta previa antepartum on her problem list.  Patient reports no complaints.  Contractions: Not present. Vag. Bleeding: None.  Movement: Present. Denies leaking of fluid.   The following portions of the patient's history were reviewed and updated as appropriate: allergies, current medications, past family history, past medical history, past social history, past surgical history and problem list. Problem list updated.  Objective:   Filed Vitals:   05/06/15 1109  BP: 125/84  Pulse: 97  Weight: 181 lb (82.101 kg)    Fetal Status: Fetal Heart Rate (bpm): 153   Movement: Present     General:  Alert, oriented and cooperative. Patient is in no acute distress.  Skin: Skin is warm and dry. No rash noted.   Cardiovascular: Normal heart rate noted  Respiratory: Normal respiratory effort, no problems with respiration noted  Abdomen: Soft, gravid, appropriate for gestational age. Pain/Pressure: Absent     Pelvic: Vag. Bleeding: None Vag D/C Character: Thin   Cervical exam deferred        Extremities: Normal range of motion.  Edema: Trace  Mental Status: Normal mood and affect. Normal behavior. Normal judgment and thought content.   Urinalysis:      Assessment and Plan:  Pregnancy: G2P0010 at [redacted]w[redacted]d  1. Supervision of high-risk pregnancy, second trimester Patient is doing well without complaints Reviewed anatomy ultrasound results with the patient. Informed patient of placenta previa and need for follow up on 06/11/2015. Pelvic rest instructions provided  2. Preexisting diabetes complicating pregnancy, antepartum, second trimester CBGs reviewed. All values within  range when patient follows diet. Fastings are consistently recorded at 100. Advised patient to consume a protein rich snack at bedtime Continue glyburide 2.5 mg BID RTC in 2 weeks to follow up on fasting CBGs  3. Advanced maternal age, primigravida, antepartum, second trimester Normal NIPPS AFP today  General obstetric precautions including but not limited to vaginal bleeding, contractions, leaking of fluid and fetal movement were reviewed in detail with the patient. Please refer to After Visit Summary for other counseling recommendations.  Return in about 2 weeks (around 05/20/2015).   Catalina AntiguaPeggy Brynlyn Dade, MD

## 2015-05-07 ENCOUNTER — Encounter: Payer: Federal, State, Local not specified - PPO | Admitting: Obstetrics & Gynecology

## 2015-05-07 LAB — ALPHA FETOPROTEIN, MATERNAL
AFP: 42.3 ng/mL
Curr Gest Age: 19.1 wks.days
MOM FOR AFP: 1.22
OPEN SPINA BIFIDA: NEGATIVE

## 2015-05-19 ENCOUNTER — Ambulatory Visit (INDEPENDENT_AMBULATORY_CARE_PROVIDER_SITE_OTHER): Payer: Federal, State, Local not specified - PPO | Admitting: Certified Nurse Midwife

## 2015-05-19 VITALS — BP 116/77 | HR 97 | Wt 184.0 lb

## 2015-05-19 DIAGNOSIS — O24912 Unspecified diabetes mellitus in pregnancy, second trimester: Secondary | ICD-10-CM

## 2015-05-19 DIAGNOSIS — O0992 Supervision of high risk pregnancy, unspecified, second trimester: Secondary | ICD-10-CM

## 2015-05-19 DIAGNOSIS — O09522 Supervision of elderly multigravida, second trimester: Secondary | ICD-10-CM

## 2015-05-19 DIAGNOSIS — O4412 Placenta previa with hemorrhage, second trimester: Secondary | ICD-10-CM

## 2015-05-19 DIAGNOSIS — O09512 Supervision of elderly primigravida, second trimester: Secondary | ICD-10-CM

## 2015-05-19 DIAGNOSIS — O24312 Unspecified pre-existing diabetes mellitus in pregnancy, second trimester: Secondary | ICD-10-CM

## 2015-05-19 DIAGNOSIS — O4402 Placenta previa specified as without hemorrhage, second trimester: Secondary | ICD-10-CM

## 2015-05-19 NOTE — Progress Notes (Signed)
Subjective:  Deborah Alexander is a 40 y.o. G2P0010 at [redacted]w[redacted]d being seen today for ongoing prenatal care.  She is currently monitored for the following issues for this high-risk pregnancy and has Supervision of high-risk pregnancy; Advanced maternal age, antepartum; Preexisting diabetes complicating pregnancy, antepartum; and Placenta previa antepartum on her problem list.  Patient reports no complaints.  Contractions: Not present. Vag. Bleeding: None.  Movement: Present. Denies leaking of fluid.   The following portions of the patient's history were reviewed and updated as appropriate: allergies, current medications, past family history, past medical history, past social history, past surgical history and problem list. Problem list updated.  Objective:   Filed Vitals:   05/19/15 1557  BP: 116/77  Pulse: 97  Weight: 184 lb (83.462 kg)    Fetal Status: Fetal Heart Rate (bpm): 145   Movement: Present     General:  Alert, oriented and cooperative. Patient is in no acute distress.  Skin: Skin is warm and dry. No rash noted.   Cardiovascular: Normal heart rate noted  Respiratory: Normal respiratory effort, no problems with respiration noted  Abdomen: Soft, gravid, appropriate for gestational age. Pain/Pressure: Absent     Pelvic: Vag. Bleeding: None Vag D/C Character: Thin   Cervical exam deferred        Extremities: Normal range of motion.  Edema: Trace  Mental Status: Normal mood and affect. Normal behavior. Normal judgment and thought content.   Urinalysis: Urine Protein: Negative Urine Glucose: Negative  Assessment and Plan:  Pregnancy: G2P0010 at [redacted]w[redacted]d  1. Preexisting diabetes complicating pregnancy, antepartum, second trimester   2. Advanced maternal age, primigravida, antepartum, second trimester   3. Placenta previa antepartum, second trimester   4. Supervision of high-risk pregnancy, second trimester   Preterm labor symptoms and general obstetric precautions  including but not limited to vaginal bleeding, contractions, leaking of fluid and fetal movement were reviewed in detail with the patient. Please refer to After Visit Summary for other counseling recommendations.  Return in about 4 weeks (around 06/16/2015).   Rhea Pink, CNM

## 2015-05-19 NOTE — Patient Instructions (Signed)

## 2015-06-07 ENCOUNTER — Encounter: Payer: Self-pay | Admitting: *Deleted

## 2015-06-11 ENCOUNTER — Ambulatory Visit (HOSPITAL_COMMUNITY): Payer: Federal, State, Local not specified - PPO

## 2015-06-15 ENCOUNTER — Encounter: Payer: Federal, State, Local not specified - PPO | Admitting: Obstetrics and Gynecology

## 2015-07-02 ENCOUNTER — Ambulatory Visit (HOSPITAL_COMMUNITY): Payer: Federal, State, Local not specified - PPO

## 2015-07-09 ENCOUNTER — Encounter (HOSPITAL_COMMUNITY): Payer: Self-pay

## 2015-07-09 ENCOUNTER — Other Ambulatory Visit (HOSPITAL_COMMUNITY): Payer: Self-pay | Admitting: Maternal and Fetal Medicine

## 2015-07-09 ENCOUNTER — Ambulatory Visit (HOSPITAL_COMMUNITY)
Admission: RE | Admit: 2015-07-09 | Discharge: 2015-07-09 | Disposition: A | Discharge status: Home / Self Care | Payer: Federal, State, Local not specified - PPO | Source: Ambulatory Visit | Attending: Maternal and Fetal Medicine | Admitting: Maternal and Fetal Medicine

## 2015-07-09 ENCOUNTER — Ambulatory Visit (INDEPENDENT_AMBULATORY_CARE_PROVIDER_SITE_OTHER): Payer: Federal, State, Local not specified - PPO | Admitting: Obstetrics & Gynecology

## 2015-07-09 VITALS — BP 123/88 | HR 100 | Wt 188.6 lb

## 2015-07-09 VITALS — BP 122/85 | HR 94 | Wt 188.0 lb

## 2015-07-09 DIAGNOSIS — O09523 Supervision of elderly multigravida, third trimester: Secondary | ICD-10-CM | POA: Insufficient documentation

## 2015-07-09 DIAGNOSIS — O24113 Pre-existing diabetes mellitus, type 2, in pregnancy, third trimester: Secondary | ICD-10-CM | POA: Insufficient documentation

## 2015-07-09 DIAGNOSIS — Z3A28 28 weeks gestation of pregnancy: Secondary | ICD-10-CM

## 2015-07-09 DIAGNOSIS — Z36 Encounter for antenatal screening of mother: Secondary | ICD-10-CM | POA: Diagnosis not present

## 2015-07-09 DIAGNOSIS — Z23 Encounter for immunization: Secondary | ICD-10-CM | POA: Diagnosis not present

## 2015-07-09 DIAGNOSIS — O09513 Supervision of elderly primigravida, third trimester: Secondary | ICD-10-CM

## 2015-07-09 DIAGNOSIS — O0993 Supervision of high risk pregnancy, unspecified, third trimester: Secondary | ICD-10-CM

## 2015-07-09 DIAGNOSIS — Z0489 Encounter for examination and observation for other specified reasons: Secondary | ICD-10-CM

## 2015-07-09 DIAGNOSIS — O4403 Placenta previa specified as without hemorrhage, third trimester: Secondary | ICD-10-CM | POA: Diagnosis present

## 2015-07-09 DIAGNOSIS — IMO0002 Reserved for concepts with insufficient information to code with codable children: Secondary | ICD-10-CM

## 2015-07-09 DIAGNOSIS — O24312 Unspecified pre-existing diabetes mellitus in pregnancy, second trimester: Secondary | ICD-10-CM

## 2015-07-09 DIAGNOSIS — O24319 Unspecified pre-existing diabetes mellitus in pregnancy, unspecified trimester: Secondary | ICD-10-CM

## 2015-07-09 DIAGNOSIS — O24313 Unspecified pre-existing diabetes mellitus in pregnancy, third trimester: Secondary | ICD-10-CM

## 2015-07-09 DIAGNOSIS — O24913 Unspecified diabetes mellitus in pregnancy, third trimester: Secondary | ICD-10-CM

## 2015-07-09 LAB — CBC
HCT: 34.3 % — ABNORMAL LOW (ref 36.0–46.0)
HEMOGLOBIN: 11.6 g/dL — AB (ref 12.0–15.0)
MCH: 28.8 pg (ref 26.0–34.0)
MCHC: 33.8 g/dL (ref 30.0–36.0)
MCV: 85.1 fL (ref 78.0–100.0)
MPV: 10.3 fL (ref 8.6–12.4)
PLATELETS: 267 10*3/uL (ref 150–400)
RBC: 4.03 MIL/uL (ref 3.87–5.11)
RDW: 13.5 % (ref 11.5–15.5)
WBC: 5.1 10*3/uL (ref 4.0–10.5)

## 2015-07-09 MED ORDER — BREAST PUMP MISC: 1.0000 | Status: DC

## 2015-07-10 LAB — RPR

## 2015-07-10 LAB — HIV ANTIBODY (ROUTINE TESTING W REFLEX): HIV: NONREACTIVE

## 2015-07-21 ENCOUNTER — Ambulatory Visit (INDEPENDENT_AMBULATORY_CARE_PROVIDER_SITE_OTHER): Payer: Federal, State, Local not specified - PPO | Admitting: Certified Nurse Midwife

## 2015-07-21 VITALS — BP 129/86 | HR 99 | Wt 193.0 lb

## 2015-07-21 DIAGNOSIS — O0993 Supervision of high risk pregnancy, unspecified, third trimester: Secondary | ICD-10-CM

## 2015-07-21 DIAGNOSIS — O24913 Unspecified diabetes mellitus in pregnancy, third trimester: Secondary | ICD-10-CM

## 2015-07-21 DIAGNOSIS — O09523 Supervision of elderly multigravida, third trimester: Secondary | ICD-10-CM

## 2015-07-21 DIAGNOSIS — O09513 Supervision of elderly primigravida, third trimester: Secondary | ICD-10-CM

## 2015-07-21 DIAGNOSIS — O24313 Unspecified pre-existing diabetes mellitus in pregnancy, third trimester: Secondary | ICD-10-CM

## 2015-07-21 NOTE — Patient Instructions (Signed)

## 2015-07-21 NOTE — Progress Notes (Signed)
Subjective:  Deborah Alexander is a 40 y.o. G2P0010 at 7133w6d being seen today for ongoing prenatal care.  She is currently monitored for the following issues for this high-risk pregnancy and has Supervision of high-risk pregnancy; Advanced maternal age, antepartum; Preexisting diabetes complicating pregnancy, antepartum; and Placenta previa antepartum on her problem list.  Patient reports no complaints.  Contractions: Not present. Vag. Bleeding: None.  Movement: Present. Denies leaking of fluid.   The following portions of the patient's history were reviewed and updated as appropriate: allergies, current medications, past family history, past medical history, past social history, past surgical history and problem list. Problem list updated.  Objective:   Filed Vitals:   07/21/15 1612  BP: 129/86  Pulse: 99  Weight: 193 lb (87.544 kg)    Fetal Status: Fetal Heart Rate (bpm): 140   Movement: Present     General:  Alert, oriented and cooperative. Patient is in no acute distress.  Skin: Skin is warm and dry. No rash noted.   Cardiovascular: Normal heart rate noted  Respiratory: Normal respiratory effort, no problems with respiration noted  Abdomen: Soft, gravid, appropriate for gestational age. Pain/Pressure: Absent     Pelvic: Vag. Bleeding: None Vag D/C Character: Thin   Cervical exam deferred        Extremities: Normal range of motion.  Edema: Trace  Mental Status: Normal mood and affect. Normal behavior. Normal judgment and thought content.   Urinalysis:      Assessment and Plan:  Pregnancy: G2P0010 at 8233w6d  1. Advanced maternal age, primigravida, antepartum, third trimester   2. Supervision of high-risk pregnancy, third trimester   3. Preexisting diabetes complicating pregnancy, antepartum, third trimester Reviewed log  Preterm labor symptoms and general obstetric precautions including but not limited to vaginal bleeding, contractions, leaking of fluid and fetal  movement were reviewed in detail with the patient. Please refer to After Visit Summary for other counseling recommendations.  Return in about 2 weeks (around 08/04/2015).   Rhea PinkLori A Idelia Caudell, CNM

## 2015-07-26 ENCOUNTER — Other Ambulatory Visit: Payer: Self-pay | Admitting: Obstetrics & Gynecology

## 2015-07-28 ENCOUNTER — Other Ambulatory Visit: Payer: Self-pay | Admitting: Obstetrics & Gynecology

## 2015-08-04 ENCOUNTER — Ambulatory Visit (INDEPENDENT_AMBULATORY_CARE_PROVIDER_SITE_OTHER): Payer: Federal, State, Local not specified - PPO | Admitting: Obstetrics and Gynecology

## 2015-08-04 ENCOUNTER — Encounter: Payer: Self-pay | Admitting: Obstetrics and Gynecology

## 2015-08-04 VITALS — BP 136/80 | HR 98 | Wt 194.0 lb

## 2015-08-04 DIAGNOSIS — O4413 Placenta previa with hemorrhage, third trimester: Secondary | ICD-10-CM

## 2015-08-04 DIAGNOSIS — O0993 Supervision of high risk pregnancy, unspecified, third trimester: Secondary | ICD-10-CM

## 2015-08-04 DIAGNOSIS — O4403 Placenta previa specified as without hemorrhage, third trimester: Secondary | ICD-10-CM

## 2015-08-04 DIAGNOSIS — O09513 Supervision of elderly primigravida, third trimester: Secondary | ICD-10-CM

## 2015-08-04 DIAGNOSIS — O24313 Unspecified pre-existing diabetes mellitus in pregnancy, third trimester: Secondary | ICD-10-CM

## 2015-08-04 DIAGNOSIS — O24913 Unspecified diabetes mellitus in pregnancy, third trimester: Secondary | ICD-10-CM

## 2015-08-04 MED ORDER — BREAST PUMP MISC: 1.0000 | Status: DC

## 2015-08-04 MED ORDER — BREAST PUMP MISC: Status: DC

## 2015-08-04 NOTE — Progress Notes (Signed)
Subjective:  Deborah Alexander is a 40 y.o. G2P0010 at 5875w6d being seen today for ongoing prenatal care.  She is currently monitored for the following issues for this high-risk pregnancy and has Supervision of high-risk pregnancy; Advanced maternal age, antepartum; Preexisting diabetes complicating pregnancy, antepartum; and Placenta previa antepartum on her problem list.  Patient reports no complaints.  Contractions: Not present. Vag. Bleeding: None.  Movement: Present. Denies leaking of fluid.   The following portions of the patient's history were reviewed and updated as appropriate: allergies, current medications, past family history, past medical history, past social history, past surgical history and problem list. Problem list updated.  Objective:   Filed Vitals:   08/04/15 0858  BP: 136/80  Pulse: 98  Weight: 194 lb (87.998 kg)    Fetal Status: Fetal Heart Rate (bpm): 152   Movement: Present     General:  Alert, oriented and cooperative. Patient is in no acute distress.  Skin: Skin is warm and dry. No rash noted.   Cardiovascular: Normal heart rate noted  Respiratory: Normal respiratory effort, no problems with respiration noted  Abdomen: Soft, gravid, appropriate for gestational age. Pain/Pressure: Present     Pelvic: Vag. Bleeding: None Vag D/C Character: Thin   Cervical exam deferred        Extremities: Normal range of motion.  Edema: Trace  Mental Status: Normal mood and affect. Normal behavior. Normal judgment and thought content.   Urinalysis:      Assessment and Plan:  Pregnancy: G2P0010 at 6875w6d  1. Advanced maternal age, primigravida, antepartum, third trimester Normal panorama  2. Preexisting diabetes complicating pregnancy, antepartum, third trimester CBGs reviewed majority within range Continue glyburide at current dose Advised to consume a bedtime snack Follow up growth ultrasound on 4/21 Start twice weekly fetal testing next week  3. Supervision of  high-risk pregnancy, third trimester   4. Placenta previa antepartum, third trimester resolved  Preterm labor symptoms and general obstetric precautions including but not limited to vaginal bleeding, contractions, leaking of fluid and fetal movement were reviewed in detail with the patient. Please refer to After Visit Summary for other counseling recommendations.  Return in about 1 week (around 08/11/2015) for ob f/u with NST.   Catalina AntiguaPeggy Bach Rocchi, MD

## 2015-08-10 ENCOUNTER — Encounter: Payer: Self-pay | Admitting: *Deleted

## 2015-08-12 ENCOUNTER — Ambulatory Visit (INDEPENDENT_AMBULATORY_CARE_PROVIDER_SITE_OTHER): Payer: Federal, State, Local not specified - PPO | Admitting: Obstetrics & Gynecology

## 2015-08-12 VITALS — BP 123/84 | HR 96 | Wt 198.0 lb

## 2015-08-12 DIAGNOSIS — O0993 Supervision of high risk pregnancy, unspecified, third trimester: Secondary | ICD-10-CM

## 2015-08-12 DIAGNOSIS — O24913 Unspecified diabetes mellitus in pregnancy, third trimester: Secondary | ICD-10-CM

## 2015-08-12 DIAGNOSIS — O09513 Supervision of elderly primigravida, third trimester: Secondary | ICD-10-CM | POA: Diagnosis not present

## 2015-08-12 DIAGNOSIS — O24313 Unspecified pre-existing diabetes mellitus in pregnancy, third trimester: Secondary | ICD-10-CM

## 2015-08-12 NOTE — Progress Notes (Signed)
Subjective:  Deborah Alexander is a 40 y.o. MH  G2P0010 at 5445w0d being seen today for ongoing prenatal care.  She is currently monitored for the following issues for this high-risk pregnancy and has Supervision of high-risk pregnancy; Advanced maternal age, antepartum; Preexisting diabetes complicating pregnancy, antepartum; and Placenta previa antepartum on her problem list.  Patient reports no complaints.  Contractions: Not present. Vag. Bleeding: None.  Movement: Present. Denies leaking of fluid.   The following portions of the patient's history were reviewed and updated as appropriate: allergies, current medications, past family history, past medical history, past social history, past surgical history and problem list. Problem list updated.  Objective:   Filed Vitals:   08/12/15 0955  BP: 123/84  Pulse: 96  Weight: 198 lb (89.812 kg)    Fetal Status: Fetal Heart Rate (bpm): 160 Fundal Height: 33 cm Movement: Present     General:  Alert, oriented and cooperative. Patient is in no acute distress.  Skin: Skin is warm and dry. No rash noted.   Cardiovascular: Normal heart rate noted  Respiratory: Normal respiratory effort, no problems with respiration noted  Abdomen: Soft, gravid, appropriate for gestational age. Pain/Pressure: Present     Pelvic: Vag. Bleeding: None Vag D/C Character: Thin   Cervical exam deferred        Extremities: Normal range of motion.  Edema: Mild pitting, slight indentation  Mental Status: Normal mood and affect. Normal behavior. Normal judgment and thought content.   Urinalysis:      Assessment and Plan:  Pregnancy: G2P0010 at 6345w0d  1. Supervision of high-risk pregnancy, third trimester   2. Preexisting diabetes complicating pregnancy, antepartum, third trimester - good sugars, continue current dose of glyburide - start twice weekly testing today  3. Advanced maternal age, primigravida, antepartum, third trimester   Preterm labor symptoms and  general obstetric precautions including but not limited to vaginal bleeding, contractions, leaking of fluid and fetal movement were reviewed in detail with the patient. Please refer to After Visit Summary for other counseling recommendations.  Return for RTC for NST, twice weekly testing.   Allie BossierMyra C Zori Benbrook, MD

## 2015-08-17 ENCOUNTER — Ambulatory Visit (INDEPENDENT_AMBULATORY_CARE_PROVIDER_SITE_OTHER): Payer: Federal, State, Local not specified - PPO | Admitting: Obstetrics and Gynecology

## 2015-08-17 VITALS — BP 115/72 | HR 92 | Wt 196.0 lb

## 2015-08-17 DIAGNOSIS — O24410 Gestational diabetes mellitus in pregnancy, diet controlled: Secondary | ICD-10-CM

## 2015-08-17 DIAGNOSIS — O0993 Supervision of high risk pregnancy, unspecified, third trimester: Secondary | ICD-10-CM

## 2015-08-17 NOTE — Progress Notes (Signed)
Patient ID: Deborah Alexander, female   DOB: Feb 18, 1976, 40 y.o.   MRN: [id number] 08/17/15 NST reviewed and reactive

## 2015-08-20 ENCOUNTER — Encounter (HOSPITAL_COMMUNITY): Payer: Self-pay

## 2015-08-20 ENCOUNTER — Ambulatory Visit (HOSPITAL_COMMUNITY)
Admission: RE | Admit: 2015-08-20 | Discharge: 2015-08-20 | Disposition: A | Discharge status: Home / Self Care | Payer: Federal, State, Local not specified - PPO | Source: Ambulatory Visit | Attending: Maternal and Fetal Medicine | Admitting: Maternal and Fetal Medicine

## 2015-08-20 ENCOUNTER — Ambulatory Visit (INDEPENDENT_AMBULATORY_CARE_PROVIDER_SITE_OTHER): Payer: Federal, State, Local not specified - PPO | Admitting: Obstetrics & Gynecology

## 2015-08-20 VITALS — BP 125/77 | HR 98 | Wt 197.0 lb

## 2015-08-20 DIAGNOSIS — O24113 Pre-existing diabetes mellitus, type 2, in pregnancy, third trimester: Secondary | ICD-10-CM | POA: Insufficient documentation

## 2015-08-20 DIAGNOSIS — O24313 Unspecified pre-existing diabetes mellitus in pregnancy, third trimester: Secondary | ICD-10-CM

## 2015-08-20 DIAGNOSIS — O24319 Unspecified pre-existing diabetes mellitus in pregnancy, unspecified trimester: Secondary | ICD-10-CM

## 2015-08-20 DIAGNOSIS — Z3A34 34 weeks gestation of pregnancy: Secondary | ICD-10-CM | POA: Insufficient documentation

## 2015-08-20 DIAGNOSIS — O24913 Unspecified diabetes mellitus in pregnancy, third trimester: Secondary | ICD-10-CM | POA: Diagnosis not present

## 2015-08-20 DIAGNOSIS — O09523 Supervision of elderly multigravida, third trimester: Secondary | ICD-10-CM | POA: Diagnosis not present

## 2015-08-20 DIAGNOSIS — O0993 Supervision of high risk pregnancy, unspecified, third trimester: Secondary | ICD-10-CM

## 2015-08-20 DIAGNOSIS — O09513 Supervision of elderly primigravida, third trimester: Secondary | ICD-10-CM

## 2015-08-20 NOTE — Progress Notes (Signed)
Subjective:  Deborah Alexander is a 40 y.o. G2P0010 (son)  at 4850w1d being seen today for ongoing prenatal care.  She is currently monitored for the following issues for this high-risk pregnancy and has Supervision of high-risk pregnancy; Advanced maternal age, antepartum; Preexisting diabetes complicating pregnancy, antepartum; and Placenta previa antepartum on her problem list.  Patient reports no complaints.  Contractions: Irregular. Vag. Bleeding: None.  Movement: Present. Denies leaking of fluid.   The following portions of the patient's history were reviewed and updated as appropriate: allergies, current medications, past family history, past medical history, past social history, past surgical history and problem list. Problem list updated.  Objective:   Filed Vitals:   08/20/15 0919  BP: 125/77  Pulse: 98  Weight: 197 lb (89.359 kg)    Fetal Status: Fetal Heart Rate (bpm): NST Fundal Height: 36 cm Movement: Present     General:  Alert, oriented and cooperative. Patient is in no acute distress.  Skin: Skin is warm and dry. No rash noted.   Cardiovascular: Normal heart rate noted  Respiratory: Normal respiratory effort, no problems with respiration noted  Abdomen: Soft, gravid, appropriate for gestational age. Pain/Pressure: Present     Pelvic: Vag. Bleeding: None Vag D/C Character: Thin   Cervical exam performed        Extremities: Normal range of motion.  Edema: Trace  Mental Status: Normal mood and affect. Normal behavior. Normal judgment and thought content.   Urinalysis:      Assessment and Plan:  Pregnancy: G2P0010 at 3050w1d  1. Supervision of high-risk pregnancy, third trimester  - Fetal nonstress test  2. Advanced maternal age, primigravida, antepartum, third trimester   3. Preexisting diabetes complicating pregnancy, antepartum, third trimester - Excellent sugar control  Preterm labor symptoms and general obstetric precautions including but not limited to  vaginal bleeding, contractions, leaking of fluid and fetal movement were reviewed in detail with the patient. Please refer to After Visit Summary for other counseling recommendations.  Return for continue twice weekly testing.   Allie BossierMyra C Jamorris Ndiaye, MD

## 2015-08-24 ENCOUNTER — Ambulatory Visit (INDEPENDENT_AMBULATORY_CARE_PROVIDER_SITE_OTHER): Payer: Federal, State, Local not specified - PPO | Admitting: *Deleted

## 2015-08-24 VITALS — BP 135/86 | HR 97 | Wt 200.0 lb

## 2015-08-24 DIAGNOSIS — O24410 Gestational diabetes mellitus in pregnancy, diet controlled: Secondary | ICD-10-CM

## 2015-08-24 DIAGNOSIS — O0993 Supervision of high risk pregnancy, unspecified, third trimester: Secondary | ICD-10-CM

## 2015-08-24 NOTE — Progress Notes (Signed)
NST performed today was reviewed and was found to be reactive.  Continue recommended antenatal testing and prenatal care.  Deborah NewcomerUgonna A Bryndon Cumbie, MD

## 2015-08-27 ENCOUNTER — Ambulatory Visit (INDEPENDENT_AMBULATORY_CARE_PROVIDER_SITE_OTHER): Payer: Federal, State, Local not specified - PPO | Admitting: Family Medicine

## 2015-08-27 VITALS — BP 121/83 | HR 94 | Wt 198.0 lb

## 2015-08-27 DIAGNOSIS — O24313 Unspecified pre-existing diabetes mellitus in pregnancy, third trimester: Secondary | ICD-10-CM

## 2015-08-27 DIAGNOSIS — O24913 Unspecified diabetes mellitus in pregnancy, third trimester: Secondary | ICD-10-CM | POA: Diagnosis not present

## 2015-08-27 DIAGNOSIS — O0993 Supervision of high risk pregnancy, unspecified, third trimester: Secondary | ICD-10-CM

## 2015-08-27 NOTE — Patient Instructions (Signed)
Third Trimester of Pregnancy The third trimester is from week 29 through week 42, months 7 through 9. The third trimester is a time when the fetus is growing rapidly. At the end of the ninth month, the fetus is about 20 inches in length and weighs 6-10 pounds.  BODY CHANGES Your body goes through many changes during pregnancy. The changes vary from woman to woman.   Your weight will continue to increase. You can expect to gain 25-35 pounds (11-16 kg) by the end of the pregnancy.  You may begin to get stretch marks on your hips, abdomen, and breasts.  You may urinate more often because the fetus is moving lower into your pelvis and pressing on your bladder.  You may develop or continue to have heartburn as a result of your pregnancy.  You may develop constipation because certain hormones are causing the muscles that push waste through your intestines to slow down.  You may develop hemorrhoids or swollen, bulging veins (varicose veins).  You may have pelvic pain because of the weight gain and pregnancy hormones relaxing your joints between the bones in your pelvis. Backaches may result from overexertion of the muscles supporting your posture.  You may have changes in your hair. These can include thickening of your hair, rapid growth, and changes in texture. Some women also have hair loss during or after pregnancy, or hair that feels dry or thin. Your hair will most likely return to normal after your baby is born.  Your breasts will continue to grow and be tender. A yellow discharge may leak from your breasts called colostrum.  Your belly button may stick out.  You may feel short of breath because of your expanding uterus.  You may notice the fetus "dropping," or moving lower in your abdomen.  You may have a bloody mucus discharge. This usually occurs a few days to a week before labor begins.  Your cervix becomes thin and soft (effaced) near your due date. WHAT TO EXPECT AT YOUR  PRENATAL EXAMS  You will have prenatal exams every 2 weeks until week 36. Then, you will have weekly prenatal exams. During a routine prenatal visit:  You will be weighed to make sure you and the fetus are growing normally.  Your blood pressure is taken.  Your abdomen will be measured to track your baby's growth.  The fetal heartbeat will be listened to.  Any test results from the previous visit will be discussed.  You may have a cervical check near your due date to see if you have effaced. At around 36 weeks, your caregiver will check your cervix. At the same time, your caregiver will also perform a test on the secretions of the vaginal tissue. This test is to determine if a type of bacteria, Group B streptococcus, is present. Your caregiver will explain this further. Your caregiver may ask you:  What your birth plan is.  How you are feeling.  If you are feeling the baby move.  If you have had any abnormal symptoms, such as leaking fluid, bleeding, severe headaches, or abdominal cramping.  If you are using any tobacco products, including cigarettes, chewing tobacco, and electronic cigarettes.  If you have any questions. Other tests or screenings that may be performed during your third trimester include:  Blood tests that check for low iron levels (anemia).  Fetal testing to check the health, activity level, and growth of the fetus. Testing is done if you have certain medical conditions or if   there are problems during the pregnancy.  HIV (human immunodeficiency virus) testing. If you are at high risk, you may be screened for HIV during your third trimester of pregnancy. FALSE LABOR You may feel small, irregular contractions that eventually go away. These are called Braxton Hicks contractions, or false labor. Contractions may last for hours, days, or even weeks before true labor sets in. If contractions come at regular intervals, intensify, or become painful, it is best to be seen  by your caregiver.  SIGNS OF LABOR   Menstrual-like cramps.  Contractions that are 5 minutes apart or less.  Contractions that start on the top of the uterus and spread down to the lower abdomen and back.  A sense of increased pelvic pressure or back pain.  A watery or bloody mucus discharge that comes from the vagina. If you have any of these signs before the 37th week of pregnancy, call your caregiver right away. You need to go to the hospital to get checked immediately. HOME CARE INSTRUCTIONS   Avoid all smoking, herbs, alcohol, and unprescribed drugs. These chemicals affect the formation and growth of the baby.  Do not use any tobacco products, including cigarettes, chewing tobacco, and electronic cigarettes. If you need help quitting, ask your health care provider. You may receive counseling support and other resources to help you quit.  Follow your caregiver's instructions regarding medicine use. There are medicines that are either safe or unsafe to take during pregnancy.  Exercise only as directed by your caregiver. Experiencing uterine cramps is a good sign to stop exercising.  Continue to eat regular, healthy meals.  Wear a good support bra for breast tenderness.  Do not use hot tubs, steam rooms, or saunas.  Wear your seat belt at all times when driving.  Avoid raw meat, uncooked cheese, cat litter boxes, and soil used by cats. These carry germs that can cause birth defects in the baby.  Take your prenatal vitamins.  Take 1500-2000 mg of calcium daily starting at the 20th week of pregnancy until you deliver your baby.  Try taking a stool softener (if your caregiver approves) if you develop constipation. Eat more high-fiber foods, such as fresh vegetables or fruit and whole grains. Drink plenty of fluids to keep your urine clear or pale yellow.  Take warm sitz baths to soothe any pain or discomfort caused by hemorrhoids. Use hemorrhoid cream if your caregiver  approves.  If you develop varicose veins, wear support hose. Elevate your feet for 15 minutes, 3-4 times a day. Limit salt in your diet.  Avoid heavy lifting, wear low heal shoes, and practice good posture.  Rest a lot with your legs elevated if you have leg cramps or low back pain.  Visit your dentist if you have not gone during your pregnancy. Use a soft toothbrush to brush your teeth and be gentle when you floss.  A sexual relationship may be continued unless your caregiver directs you otherwise.  Do not travel far distances unless it is absolutely necessary and only with the approval of your caregiver.  Take prenatal classes to understand, practice, and ask questions about the labor and delivery.  Make a trial run to the hospital.  Pack your hospital bag.  Prepare the baby's nursery.  Continue to go to all your prenatal visits as directed by your caregiver. SEEK MEDICAL CARE IF:  You are unsure if you are in labor or if your water has broken.  You have dizziness.  You have   mild pelvic cramps, pelvic pressure, or nagging pain in your abdominal area.  You have persistent nausea, vomiting, or diarrhea.  You have a bad smelling vaginal discharge.  You have pain with urination. SEEK IMMEDIATE MEDICAL CARE IF:   You have a fever.  You are leaking fluid from your vagina.  You have spotting or bleeding from your vagina.  You have severe abdominal cramping or pain.  You have rapid weight loss or gain.  You have shortness of breath with chest pain.  You notice sudden or extreme swelling of your face, hands, ankles, feet, or legs.  You have not felt your baby move in over an hour.  You have severe headaches that do not go away with medicine.  You have vision changes.   This information is not intended to replace advice given to you by your health care provider. Make sure you discuss any questions you have with your health care provider.   Document Released:  04/11/2001 Document Revised: 05/08/2014 Document Reviewed: 06/18/2012 Elsevier Interactive Patient Education 2016 [employer]  Breastfeeding Deciding to breastfeed is one of the best choices you can make for you and your baby. A change in hormones during pregnancy causes your breast tissue to grow and increases the number and size of your milk ducts. These hormones also allow proteins, sugars, and fats from your blood supply to make breast milk in your milk-producing glands. Hormones prevent breast milk from being released before your baby is born as well as prompt milk flow after birth. Once breastfeeding has begun, thoughts of your baby, as well as his or her sucking or crying, can stimulate the release of milk from your milk-producing glands.  BENEFITS OF BREASTFEEDING For Your Baby  Your first milk (colostrum) helps your baby's digestive system function better.  There are antibodies in your milk that help your baby fight off infections.  Your baby has a lower incidence of asthma, allergies, and sudden infant death syndrome.  The nutrients in breast milk are better for your baby than infant formulas and are designed uniquely for your baby's needs.  Breast milk improves your baby's brain development.  Your baby is less likely to develop other conditions, such as childhood obesity, asthma, or type 2 diabetes mellitus. For You  Breastfeeding helps to create a very special bond between you and your baby.  Breastfeeding is convenient. Breast milk is always available at the correct temperature and costs nothing.  Breastfeeding helps to burn calories and helps you lose the weight gained during pregnancy.  Breastfeeding makes your uterus contract to its prepregnancy size faster and slows bleeding (lochia) after you give birth.   Breastfeeding helps to lower your risk of developing type 2 diabetes mellitus, osteoporosis, and breast or ovarian cancer later in life. SIGNS THAT YOUR BABY IS  HUNGRY Early Signs of Hunger  Increased alertness or activity.  Stretching.  Movement of the head from side to side.  Movement of the head and opening of the mouth when the corner of the mouth or cheek is stroked (rooting).  Increased sucking sounds, smacking lips, cooing, sighing, or squeaking.  Hand-to-mouth movements.  Increased sucking of fingers or hands. Late Signs of Hunger  Fussing.  Intermittent crying. Extreme Signs of Hunger Signs of extreme hunger will require calming and consoling before your baby will be able to breastfeed successfully. Do not wait for the following signs of extreme hunger to occur before you initiate breastfeeding:  Restlessness.  A loud, strong cry.  Screaming.   BREASTFEEDING BASICS Breastfeeding Initiation  Find a comfortable place to sit or lie down, with your neck and back well supported.  Place a pillow or rolled up blanket under your baby to bring him or her to the level of your breast (if you are seated). Nursing pillows are specially designed to help support your arms and your baby while you breastfeed.  Make sure that your baby's abdomen is facing your abdomen.  Gently massage your breast. With your fingertips, massage from your chest wall toward your nipple in a circular motion. This encourages milk flow. You may need to continue this action during the feeding if your milk flows slowly.  Support your breast with 4 fingers underneath and your thumb above your nipple. Make sure your fingers are well away from your nipple and your baby's mouth.  Stroke your baby's lips gently with your finger or nipple.  When your baby's mouth is open wide enough, quickly bring your baby to your breast, placing your entire nipple and as much of the colored area around your nipple (areola) as possible into your baby's mouth.  More areola should be visible above your baby's upper lip than below the lower lip.  Your baby's tongue should be between his  or her lower gum and your breast.  Ensure that your baby's mouth is correctly positioned around your nipple (latched). Your baby's lips should create a seal on your breast and be turned out (everted).  It is common for your baby to suck about 2-3 minutes in order to start the flow of breast milk. Latching Teaching your baby how to latch on to your breast properly is very important. An improper latch can cause nipple pain and decreased milk supply for you and poor weight gain in your baby. Also, if your baby is not latched onto your nipple properly, he or she may swallow some air during feeding. This can make your baby fussy. Burping your baby when you switch breasts during the feeding can help to get rid of the air. However, teaching your baby to latch on properly is still the best way to prevent fussiness from swallowing air while breastfeeding. Signs that your baby has successfully latched on to your nipple:  Silent tugging or silent sucking, without causing you pain.  Swallowing heard between every 3-4 sucks.  Muscle movement above and in front of his or her ears while sucking. Signs that your baby has not successfully latched on to nipple:  Sucking sounds or smacking sounds from your baby while breastfeeding.  Nipple pain. If you think your baby has not latched on correctly, slip your finger into the corner of your baby's mouth to break the suction and place it between your baby's gums. Attempt breastfeeding initiation again. Signs of Successful Breastfeeding Signs from your baby:  A gradual decrease in the number of sucks or complete cessation of sucking.  Falling asleep.  Relaxation of his or her body.  Retention of a small amount of milk in his or her mouth.  Letting go of your breast by himself or herself. Signs from you:  Breasts that have increased in firmness, weight, and size 1-3 hours after feeding.  Breasts that are softer immediately after  breastfeeding.  Increased milk volume, as well as a change in milk consistency and color by the fifth day of breastfeeding.  Nipples that are not sore, cracked, or bleeding. Signs That Your Baby is Getting Enough Milk  Wetting at least 3 diapers in a 24-hour period.   The urine should be clear and pale yellow by age 5 days.  At least 3 stools in a 24-hour period by age 5 days. The stool should be soft and yellow.  At least 3 stools in a 24-hour period by age 7 days. The stool should be seedy and yellow.  No loss of weight greater than 10% of birth weight during the first 3 days of age.  Average weight gain of 4-7 ounces (113-198 g) per week after age 4 days.  Consistent daily weight gain by age 5 days, without weight loss after the age of 2 weeks. After a feeding, your baby may spit up a small amount. This is common. BREASTFEEDING FREQUENCY AND DURATION Frequent feeding will help you make more milk and can prevent sore nipples and breast engorgement. Breastfeed when you feel the need to reduce the fullness of your breasts or when your baby shows signs of hunger. This is called "breastfeeding on demand." Avoid introducing a pacifier to your baby while you are working to establish breastfeeding (the first 4-6 weeks after your baby is born). After this time you may choose to use a pacifier. Research has shown that pacifier use during the first year of a baby's life decreases the risk of sudden infant death syndrome (SIDS). Allow your baby to feed on each breast as long as he or she wants. Breastfeed until your baby is finished feeding. When your baby unlatches or falls asleep while feeding from the first breast, offer the second breast. Because newborns are often sleepy in the first few weeks of life, you may need to awaken your baby to get him or her to feed. Breastfeeding times will vary from baby to baby. However, the following rules can serve as a guide to help you ensure that your baby is  properly fed:  Newborns (babies 4 weeks of age or younger) may breastfeed every 1-3 hours.  Newborns should not go longer than 3 hours during the day or 5 hours during the night without breastfeeding.  You should breastfeed your baby a minimum of 8 times in a 24-hour period until you begin to introduce solid foods to your baby at around 6 months of age. BREAST MILK PUMPING Pumping and storing breast milk allows you to ensure that your baby is exclusively fed your breast milk, even at times when you are unable to breastfeed. This is especially important if you are going back to work while you are still breastfeeding or when you are not able to be present during feedings. Your lactation consultant can give you guidelines on how long it is safe to store breast milk. A breast pump is a machine that allows you to pump milk from your breast into a sterile bottle. The pumped breast milk can then be stored in a refrigerator or freezer. Some breast pumps are operated by hand, while others use electricity. Ask your lactation consultant which type will work best for you. Breast pumps can be purchased, but some hospitals and breastfeeding support groups lease breast pumps on a monthly basis. A lactation consultant can teach you how to hand express breast milk, if you prefer not to use a pump. CARING FOR YOUR BREASTS WHILE YOU BREASTFEED Nipples can become dry, cracked, and sore while breastfeeding. The following recommendations can help keep your breasts moisturized and healthy:  Avoid using soap on your nipples.  Wear a supportive bra. Although not required, special nursing bras and tank tops are designed to allow access to your   breasts for breastfeeding without taking off your entire bra or top. Avoid wearing underwire-style bras or extremely tight bras.  Air dry your nipples for 3-4minutes after each feeding.  Use only cotton bra pads to absorb leaked breast milk. Leaking of breast milk between feedings  is normal.  Use lanolin on your nipples after breastfeeding. Lanolin helps to maintain your skin's normal moisture barrier. If you use pure lanolin, you do not need to wash it off before feeding your baby again. Pure lanolin is not toxic to your baby. You may also hand express a few drops of breast milk and gently massage that milk into your nipples and allow the milk to air dry. In the first few weeks after giving birth, some women experience extremely full breasts (engorgement). Engorgement can make your breasts feel heavy, warm, and tender to the touch. Engorgement peaks within 3-5 days after you give birth. The following recommendations can help ease engorgement:  Completely empty your breasts while breastfeeding or pumping. You may want to start by applying warm, moist heat (in the shower or with warm water-soaked hand towels) just before feeding or pumping. This increases circulation and helps the milk flow. If your baby does not completely empty your breasts while breastfeeding, pump any extra milk after he or she is finished.  Wear a snug bra (nursing or regular) or tank top for 1-2 days to signal your body to slightly decrease milk production.  Apply ice packs to your breasts, unless this is too uncomfortable for you.  Make sure that your baby is latched on and positioned properly while breastfeeding. If engorgement persists after 48 hours of following these recommendations, contact your health care provider or a lactation consultant. OVERALL HEALTH CARE RECOMMENDATIONS WHILE BREASTFEEDING  Eat healthy foods. Alternate between meals and snacks, eating 3 of each per day. Because what you eat affects your breast milk, some of the foods may make your baby more irritable than usual. Avoid eating these foods if you are sure that they are negatively affecting your baby.  Drink milk, fruit juice, and water to satisfy your thirst (about 10 glasses a day).  Rest often, relax, and continue to take  your prenatal vitamins to prevent fatigue, stress, and anemia.  Continue breast self-awareness checks.  Avoid chewing and smoking tobacco. Chemicals from cigarettes that pass into breast milk and exposure to secondhand smoke may harm your baby.  Avoid alcohol and drug use, including marijuana. Some medicines that may be harmful to your baby can pass through breast milk. It is important to ask your health care provider before taking any medicine, including all over-the-counter and prescription medicine as well as vitamin and herbal supplements. It is possible to become pregnant while breastfeeding. If birth control is desired, ask your health care provider about options that will be safe for your baby. SEEK MEDICAL CARE IF:  You feel like you want to stop breastfeeding or have become frustrated with breastfeeding.  You have painful breasts or nipples.  Your nipples are cracked or bleeding.  Your breasts are red, tender, or warm.  You have a swollen area on either breast.  You have a fever or chills.  You have nausea or vomiting.  You have drainage other than breast milk from your nipples.  Your breasts do not become full before feedings by the fifth day after you give birth.  You feel sad and depressed.  Your baby is too sleepy to eat well.  Your baby is having trouble sleeping.     Your baby is wetting less than 3 diapers in a 24-hour period.  Your baby has less than 3 stools in a 24-hour period.  Your baby's skin or the white part of his or her eyes becomes yellow.   Your baby is not gaining weight by 5 days of age. SEEK IMMEDIATE MEDICAL CARE IF:  Your baby is overly tired (lethargic) and does not want to wake up and feed.  Your baby develops an unexplained fever.   This information is not intended to replace advice given to you by your health care provider. Make sure you discuss any questions you have with your health care provider.   Document Released: 04/17/2005  Document Revised: 01/06/2015 Document Reviewed: 10/09/2012 Elsevier Interactive Patient Education 2016 [employer]  

## 2015-08-27 NOTE — Progress Notes (Signed)
Subjective:  Deborah Alexander is a 40 y.o. G2P0010 at 7267w1d being seen today for ongoing prenatal care.  She is currently monitored for the following issues for this high-risk pregnancy and has Supervision of high-risk pregnancy; Advanced maternal age, antepartum; Preexisting diabetes complicating pregnancy, antepartum; and Placenta previa antepartum on her problem list.  Patient reports no complaints.  Contractions: Irregular. Vag. Bleeding: None.  Movement: Present. Denies leaking of fluid.   The following portions of the patient's history were reviewed and updated as appropriate: allergies, current medications, past family history, past medical history, past social history, past surgical history and problem list. Problem list updated.  Objective:   Filed Vitals:   08/27/15 0921  BP: 121/83  Pulse: 94  Weight: 198 lb (89.812 kg)    Fetal Status: Fetal Heart Rate (bpm): NST   Movement: Present     General:  Alert, oriented and cooperative. Patient is in no acute distress.  Skin: Skin is warm and dry. No rash noted.   Cardiovascular: Normal heart rate noted  Respiratory: Normal respiratory effort, no problems with respiration noted  Abdomen: Soft, gravid, appropriate for gestational age. Pain/Pressure: Present     Pelvic: Vag. Bleeding: None Vag D/C Character: Thin   Cervical exam deferred        Extremities: Normal range of motion.  Edema: Trace  Mental Status: Normal mood and affect. Normal behavior. Normal judgment and thought content.   FBS 79-95 (2 > 90) 2 hour pp 74-147 (4 out of range) NST reviewed and reactive. AFI 16 Assessment and Plan:  Pregnancy: G2P0010 at 7667w1d  1. Preexisting diabetes complicating pregnancy, antepartum, third trimester Continue Glyburide, ASA BS monitoring - Fetal nonstress test - Amniotic fluid index  2. Supervision of high-risk pregnancy, third trimester Cultures next visit  Preterm labor symptoms and general obstetric precautions  including but not limited to vaginal bleeding, contractions, leaking of fluid and fetal movement were reviewed in detail with the patient. Please refer to After Visit Summary for other counseling recommendations.  Return in 1 week (on 09/03/2015) for OB visit and NST, NST only in 4 days.   Reva Boresanya S Tegh Franek, MD

## 2015-08-31 ENCOUNTER — Ambulatory Visit (INDEPENDENT_AMBULATORY_CARE_PROVIDER_SITE_OTHER): Payer: Federal, State, Local not specified - PPO | Admitting: *Deleted

## 2015-08-31 VITALS — BP 133/81 | Wt 200.0 lb

## 2015-08-31 DIAGNOSIS — O24410 Gestational diabetes mellitus in pregnancy, diet controlled: Secondary | ICD-10-CM

## 2015-09-03 ENCOUNTER — Ambulatory Visit (INDEPENDENT_AMBULATORY_CARE_PROVIDER_SITE_OTHER): Payer: Federal, State, Local not specified - PPO | Admitting: Family Medicine

## 2015-09-03 VITALS — Wt 200.0 lb

## 2015-09-03 DIAGNOSIS — O24913 Unspecified diabetes mellitus in pregnancy, third trimester: Secondary | ICD-10-CM | POA: Diagnosis not present

## 2015-09-03 DIAGNOSIS — Z36 Encounter for antenatal screening of mother: Secondary | ICD-10-CM | POA: Diagnosis not present

## 2015-09-03 DIAGNOSIS — Z113 Encounter for screening for infections with a predominantly sexual mode of transmission: Secondary | ICD-10-CM | POA: Diagnosis not present

## 2015-09-03 DIAGNOSIS — O0993 Supervision of high risk pregnancy, unspecified, third trimester: Secondary | ICD-10-CM

## 2015-09-03 DIAGNOSIS — O24313 Unspecified pre-existing diabetes mellitus in pregnancy, third trimester: Secondary | ICD-10-CM

## 2015-09-03 LAB — OB RESULTS CONSOLE GBS: STREP GROUP B AG: POSITIVE

## 2015-09-03 NOTE — Patient Instructions (Signed)
Gestational Diabetes Mellitus Gestational diabetes mellitus, often simply referred to as gestational diabetes, is a type of diabetes that some women develop during pregnancy. In gestational diabetes, the pancreas does not make enough insulin (a hormone), the cells are less responsive to the insulin that is made (insulin resistance), or both.Normally, insulin moves sugars from food into the tissue cells. The tissue cells use the sugars for energy. The lack of insulin or the lack of normal response to insulin causes excess sugars to build up in the blood instead of going into the tissue cells. As a result, high blood sugar (hyperglycemia) develops. The effect of high sugar (glucose) levels can cause many problems.  RISK FACTORS You have an increased chance of developing gestational diabetes if you have a family history of diabetes and also have one or more of the following risk factors:  A body mass index over 30 (obesity).  A previous pregnancy with gestational diabetes.  An older age at the time of pregnancy. If blood glucose levels are kept in the normal range during pregnancy, women can have a healthy pregnancy. If your blood glucose levels are not well controlled, there may be risks to you, your unborn baby (fetus), your labor and delivery, or your newborn baby.  SYMPTOMS  If symptoms are experienced, they are much like symptoms you would normally expect during pregnancy. The symptoms of gestational diabetes include:   Increased thirst (polydipsia).  Increased urination (polyuria).  Increased urination during the night (nocturia).  Weight loss. This weight loss may be rapid.  Frequent, recurring infections.  Tiredness (fatigue).  Weakness.  Vision changes, such as blurred vision.  Fruity smell to your breath.  Abdominal pain. DIAGNOSIS Diabetes is diagnosed when blood glucose levels are increased. Your blood glucose level may be checked by one or more of the following blood  tests:  A fasting blood glucose test. You will not be allowed to eat for at least 8 hours before a blood sample is taken.  A random blood glucose test. Your blood glucose is checked at any time of the day regardless of when you ate.  An oral glucose tolerance test (OGTT). Your blood glucose is measured after you have not eaten (fasted) for 1-3 hours and then after you drink a glucose-containing beverage. Since the hormones that cause insulin resistance are highest at about 24-28 weeks of a pregnancy, an OGTT is usually performed during that time. If you have risk factors, you may be screened for undiagnosed type 2 diabetes at your first prenatal visit. TREATMENT  Gestational diabetes should be managed first with diet and exercise. Medicines may be added only if they are needed.  You will need to take diabetes medicine or insulin daily to keep blood glucose levels in the desired range.  You will need to match insulin dosing with exercise and healthy food choices. If you have gestational diabetes, your treatment goal is to maintain the following blood glucose levels:  Before meals (preprandial): at or below 95 mg/dL.  After meals (postprandial):  One hour after a meal: at or below 140 mg/dL.  Two hours after a meal: at or below 120 mg/dL. If you have pre-existing type 1 or type 2 diabetes, your treatment goal is to maintain the following blood glucose levels:  Before meals, at bedtime, and overnight: 60-99 mg/dL.  After meals: peak of 100-129 mg/dL. HOME CARE INSTRUCTIONS   Have your hemoglobin A1c level checked twice a year.  Perform daily blood glucose monitoring as directed by   your health care provider. It is common to perform frequent blood glucose monitoring.  Monitor urine ketones when you are ill and as directed by your health care provider.  Take your diabetes medicine and insulin as directed by your health care provider to maintain your blood glucose level in the desired  range.  Never run out of diabetes medicine or insulin. It is needed every day.  Adjust insulin based on your intake of carbohydrates. Carbohydrates can raise blood glucose levels but need to be included in your diet. Carbohydrates provide vitamins, minerals, and fiber which are an essential part of a healthy diet. Carbohydrates are found in fruits, vegetables, whole grains, dairy products, legumes, and foods containing added sugars.  Eat healthy foods. Alternate 3 meals with 3 snacks.  Maintain a healthy weight gain. The usual total expected weight gain varies according to your prepregnancy body mass index (BMI).  Carry a medical alert card or wear your medical alert jewelry.  Carry a 15-gram carbohydrate snack with you at all times to treat low blood glucose (hypoglycemia). Some examples of 15-gram carbohydrate snacks include:  Glucose tablets, 3 or 4.  Glucose gel, 15-gram tube.  Raisins, 2 tablespoons (24 g).  Jelly beans, 6.  Animal crackers, 8.  Fruit juice, regular soda, or low-fat milk, 4 ounces (120 mL).  Gummy treats, 9.  Recognize hypoglycemia. Hypoglycemia during pregnancy occurs with blood glucose levels of 60 mg/dL and below. The risk for hypoglycemia increases when fasting or skipping meals, during or after intense exercise, and during sleep. Hypoglycemia symptoms can include:  Tremors or shakes.  Decreased ability to concentrate.  Sweating.  Increased heart rate.  Headache.  Dry mouth.  Hunger.  Irritability.  Anxiety.  Restless sleep.  Altered speech or coordination.  Confusion.  Treat hypoglycemia promptly. If you are alert and able to safely swallow, follow the 15:15 rule:  Take 15-20 grams of rapid-acting glucose or carbohydrate. Rapid-acting options include glucose gel, glucose tablets, or 4 ounces (120 mL) of fruit juice, regular soda, or low-fat milk.  Check your blood glucose level 15 minutes after taking the glucose.  Take 15-20  grams more of glucose if the repeat blood glucose level is still 70 mg/dL or below.  Eat a meal or snack within 1 hour once blood glucose levels return to normal.  Be alert to polyuria (excess urination) and polydipsia (excess thirst) which are early signs of hyperglycemia. An early awareness of hyperglycemia allows for prompt treatment. Treat hyperglycemia as directed by your health care provider.  Engage in at least 30 minutes of physical activity a day or as directed by your health care provider. Ten minutes of physical activity timed 30 minutes after each meal is encouraged to control postprandial blood glucose levels.  Adjust your insulin dosing and food intake as needed if you start a new exercise or sport.  Follow your sick-day plan at any time you are unable to eat or drink as usual.  Avoid tobacco and alcohol use.  Keep all follow-up visits as directed by your health care provider.  Follow the advice of your health care provider regarding your prenatal and post-delivery (postpartum) appointments, meal planning, exercise, medicines, vitamins, blood tests, other medical tests, and physical activities.  Perform daily skin and foot care. Examine your skin and feet daily for cuts, bruises, redness, nail problems, bleeding, blisters, or sores.  Brush your teeth and gums at least twice a day and floss at least once a day. Follow up with your dentist   regularly.  Schedule an eye exam during the first trimester of your pregnancy or as directed by your health care provider.  Share your diabetes management plan with your workplace or school.  Stay up-to-date with immunizations.  Learn to manage stress.  Obtain ongoing diabetes education and support as needed.  Learn about and consider breastfeeding your baby.  You should have your blood sugar level checked 6-12 weeks after delivery. This is done with an oral glucose tolerance test (OGTT). SEEK MEDICAL CARE IF:   You are unable to  eat food or drink fluids for more than 6 hours.  You have nausea and vomiting for more than 6 hours.  You have a blood glucose level of 200 mg/dL and you have ketones in your urine.  There is a change in mental status.  You develop vision problems.  You have a persistent headache.  You have upper abdominal pain or discomfort.  You develop an additional serious illness.  You have diarrhea for more than 6 hours.  You have been sick or have had a fever for a couple of days and are not getting better. SEEK IMMEDIATE MEDICAL CARE IF:   You have difficulty breathing.  You no longer feel the baby moving.  You are bleeding or have discharge from your vagina.  You start having premature contractions or labor. MAKE SURE YOU:  Understand these instructions.  Will watch your condition.  Will get help right away if you are not doing well or get worse.   This information is not intended to replace advice given to you by your health care provider. Make sure you discuss any questions you have with your health care provider.   Document Released: 07/24/2000 Document Revised: 05/08/2014 Document Reviewed: 11/14/2011 [employer] Interactive Patient Education 2016 [employer]  Breastfeeding Deciding to breastfeed is one of the best choices you can make for you and your baby. A change in hormones during pregnancy causes your breast tissue to grow and increases the number and size of your milk ducts. These hormones also allow proteins, sugars, and fats from your blood supply to make breast milk in your milk-producing glands. Hormones prevent breast milk from being released before your baby is born as well as prompt milk flow after birth. Once breastfeeding has begun, thoughts of your baby, as well as his or her sucking or crying, can stimulate the release of milk from your milk-producing glands.  BENEFITS OF BREASTFEEDING For Your Baby  Your first milk (colostrum) helps your baby's digestive  system function better.  There are antibodies in your milk that help your baby fight off infections.  Your baby has a lower incidence of asthma, allergies, and sudden infant death syndrome.  The nutrients in breast milk are better for your baby than infant formulas and are designed uniquely for your baby's needs.  Breast milk improves your baby's brain development.  Your baby is less likely to develop other conditions, such as childhood obesity, asthma, or type 2 diabetes mellitus. For You  Breastfeeding helps to create a very special bond between you and your baby.  Breastfeeding is convenient. Breast milk is always available at the correct temperature and costs nothing.  Breastfeeding helps to burn calories and helps you lose the weight gained during pregnancy.  Breastfeeding makes your uterus contract to its prepregnancy size faster and slows bleeding (lochia) after you give birth.   Breastfeeding helps to lower your risk of developing type 2 diabetes mellitus, osteoporosis, and breast or ovarian cancer   later in life. SIGNS THAT YOUR BABY IS HUNGRY Early Signs of Hunger  Increased alertness or activity.  Stretching.  Movement of the head from side to side.  Movement of the head and opening of the mouth when the corner of the mouth or cheek is stroked (rooting).  Increased sucking sounds, smacking lips, cooing, sighing, or squeaking.  Hand-to-mouth movements.  Increased sucking of fingers or hands. Late Signs of Hunger  Fussing.  Intermittent crying. Extreme Signs of Hunger Signs of extreme hunger will require calming and consoling before your baby will be able to breastfeed successfully. Do not wait for the following signs of extreme hunger to occur before you initiate breastfeeding:  Restlessness.  A loud, strong cry.  Screaming. BREASTFEEDING BASICS Breastfeeding Initiation  Find a comfortable place to sit or lie down, with your neck and back well  supported.  Place a pillow or rolled up blanket under your baby to bring him or her to the level of your breast (if you are seated). Nursing pillows are specially designed to help support your arms and your baby while you breastfeed.  Make sure that your baby's abdomen is facing your abdomen.  Gently massage your breast. With your fingertips, massage from your chest wall toward your nipple in a circular motion. This encourages milk flow. You may need to continue this action during the feeding if your milk flows slowly.  Support your breast with 4 fingers underneath and your thumb above your nipple. Make sure your fingers are well away from your nipple and your baby's mouth.  Stroke your baby's lips gently with your finger or nipple.  When your baby's mouth is open wide enough, quickly bring your baby to your breast, placing your entire nipple and as much of the colored area around your nipple (areola) as possible into your baby's mouth.  More areola should be visible above your baby's upper lip than below the lower lip.  Your baby's tongue should be between his or her lower gum and your breast.  Ensure that your baby's mouth is correctly positioned around your nipple (latched). Your baby's lips should create a seal on your breast and be turned out (everted).  It is common for your baby to suck about 2-3 minutes in order to start the flow of breast milk. Latching Teaching your baby how to latch on to your breast properly is very important. An improper latch can cause nipple pain and decreased milk supply for you and poor weight gain in your baby. Also, if your baby is not latched onto your nipple properly, he or she may swallow some air during feeding. This can make your baby fussy. Burping your baby when you switch breasts during the feeding can help to get rid of the air. However, teaching your baby to latch on properly is still the best way to prevent fussiness from swallowing air while  breastfeeding. Signs that your baby has successfully latched on to your nipple:  Silent tugging or silent sucking, without causing you pain.  Swallowing heard between every 3-4 sucks.  Muscle movement above and in front of his or her ears while sucking. Signs that your baby has not successfully latched on to nipple:  Sucking sounds or smacking sounds from your baby while breastfeeding.  Nipple pain. If you think your baby has not latched on correctly, slip your finger into the corner of your baby's mouth to break the suction and place it between your baby's gums. Attempt breastfeeding initiation again. Signs   of Successful Breastfeeding Signs from your baby:  A gradual decrease in the number of sucks or complete cessation of sucking.  Falling asleep.  Relaxation of his or her body.  Retention of a small amount of milk in his or her mouth.  Letting go of your breast by himself or herself. Signs from you:  Breasts that have increased in firmness, weight, and size 1-3 hours after feeding.  Breasts that are softer immediately after breastfeeding.  Increased milk volume, as well as a change in milk consistency and color by the fifth day of breastfeeding.  Nipples that are not sore, cracked, or bleeding. Signs That Your Baby is Getting Enough Milk  Wetting at least 3 diapers in a 24-hour period. The urine should be clear and pale yellow by age 5 days.  At least 3 stools in a 24-hour period by age 5 days. The stool should be soft and yellow.  At least 3 stools in a 24-hour period by age 7 days. The stool should be seedy and yellow.  No loss of weight greater than 10% of birth weight during the first 3 days of age.  Average weight gain of 4-7 ounces (113-198 g) per week after age 4 days.  Consistent daily weight gain by age 5 days, without weight loss after the age of 2 weeks. After a feeding, your baby may spit up a small amount. This is common. BREASTFEEDING FREQUENCY AND  DURATION Frequent feeding will help you make more milk and can prevent sore nipples and breast engorgement. Breastfeed when you feel the need to reduce the fullness of your breasts or when your baby shows signs of hunger. This is called "breastfeeding on demand." Avoid introducing a pacifier to your baby while you are working to establish breastfeeding (the first 4-6 weeks after your baby is born). After this time you may choose to use a pacifier. Research has shown that pacifier use during the first year of a baby's life decreases the risk of sudden infant death syndrome (SIDS). Allow your baby to feed on each breast as long as he or she wants. Breastfeed until your baby is finished feeding. When your baby unlatches or falls asleep while feeding from the first breast, offer the second breast. Because newborns are often sleepy in the first few weeks of life, you may need to awaken your baby to get him or her to feed. Breastfeeding times will vary from baby to baby. However, the following rules can serve as a guide to help you ensure that your baby is properly fed:  Newborns (babies 4 weeks of age or younger) may breastfeed every 1-3 hours.  Newborns should not go longer than 3 hours during the day or 5 hours during the night without breastfeeding.  You should breastfeed your baby a minimum of 8 times in a 24-hour period until you begin to introduce solid foods to your baby at around 6 months of age. BREAST MILK PUMPING Pumping and storing breast milk allows you to ensure that your baby is exclusively fed your breast milk, even at times when you are unable to breastfeed. This is especially important if you are going back to work while you are still breastfeeding or when you are not able to be present during feedings. Your lactation consultant can give you guidelines on how long it is safe to store breast milk. A breast pump is a machine that allows you to pump milk from your breast into a sterile bottle.  The pumped   breast milk can then be stored in a refrigerator or freezer. Some breast pumps are operated by hand, while others use electricity. Ask your lactation consultant which type will work best for you. Breast pumps can be purchased, but some hospitals and breastfeeding support groups lease breast pumps on a monthly basis. A lactation consultant can teach you how to hand express breast milk, if you prefer not to use a pump. CARING FOR YOUR BREASTS WHILE YOU BREASTFEED Nipples can become dry, cracked, and sore while breastfeeding. The following recommendations can help keep your breasts moisturized and healthy:  Avoid using soap on your nipples.  Wear a supportive bra. Although not required, special nursing bras and tank tops are designed to allow access to your breasts for breastfeeding without taking off your entire bra or top. Avoid wearing underwire-style bras or extremely tight bras.  Air dry your nipples for 3-4minutes after each feeding.  Use only cotton bra pads to absorb leaked breast milk. Leaking of breast milk between feedings is normal.  Use lanolin on your nipples after breastfeeding. Lanolin helps to maintain your skin's normal moisture barrier. If you use pure lanolin, you do not need to wash it off before feeding your baby again. Pure lanolin is not toxic to your baby. You may also hand express a few drops of breast milk and gently massage that milk into your nipples and allow the milk to air dry. In the first few weeks after giving birth, some women experience extremely full breasts (engorgement). Engorgement can make your breasts feel heavy, warm, and tender to the touch. Engorgement peaks within 3-5 days after you give birth. The following recommendations can help ease engorgement:  Completely empty your breasts while breastfeeding or pumping. You may want to start by applying warm, moist heat (in the shower or with warm water-soaked hand towels) just before feeding or pumping.  This increases circulation and helps the milk flow. If your baby does not completely empty your breasts while breastfeeding, pump any extra milk after he or she is finished.  Wear a snug bra (nursing or regular) or tank top for 1-2 days to signal your body to slightly decrease milk production.  Apply ice packs to your breasts, unless this is too uncomfortable for you.  Make sure that your baby is latched on and positioned properly while breastfeeding. If engorgement persists after 48 hours of following these recommendations, contact your health care provider or a lactation consultant. OVERALL HEALTH CARE RECOMMENDATIONS WHILE BREASTFEEDING  Eat healthy foods. Alternate between meals and snacks, eating 3 of each per day. Because what you eat affects your breast milk, some of the foods may make your baby more irritable than usual. Avoid eating these foods if you are sure that they are negatively affecting your baby.  Drink milk, fruit juice, and water to satisfy your thirst (about 10 glasses a day).  Rest often, relax, and continue to take your prenatal vitamins to prevent fatigue, stress, and anemia.  Continue breast self-awareness checks.  Avoid chewing and smoking tobacco. Chemicals from cigarettes that pass into breast milk and exposure to secondhand smoke may harm your baby.  Avoid alcohol and drug use, including marijuana. Some medicines that may be harmful to your baby can pass through breast milk. It is important to ask your health care provider before taking any medicine, including all over-the-counter and prescription medicine as well as vitamin and herbal supplements. It is possible to become pregnant while breastfeeding. If birth control is desired, ask   your health care provider about options that will be safe for your baby. SEEK MEDICAL CARE IF:  You feel like you want to stop breastfeeding or have become frustrated with breastfeeding.  You have painful breasts or  nipples.  Your nipples are cracked or bleeding.  Your breasts are red, tender, or warm.  You have a swollen area on either breast.  You have a fever or chills.  You have nausea or vomiting.  You have drainage other than breast milk from your nipples.  Your breasts do not become full before feedings by the fifth day after you give birth.  You feel sad and depressed.  Your baby is too sleepy to eat well.  Your baby is having trouble sleeping.   Your baby is wetting less than 3 diapers in a 24-hour period.  Your baby has less than 3 stools in a 24-hour period.  Your baby's skin or the white part of his or her eyes becomes yellow.   Your baby is not gaining weight by 5 days of age. SEEK IMMEDIATE MEDICAL CARE IF:  Your baby is overly tired (lethargic) and does not want to wake up and feed.  Your baby develops an unexplained fever.   This information is not intended to replace advice given to you by your health care provider. Make sure you discuss any questions you have with your health care provider.   Document Released: 04/17/2005 Document Revised: 01/06/2015 Document Reviewed: 10/09/2012 [employer] Interactive Patient Education 2016 [employer]  

## 2015-09-03 NOTE — Progress Notes (Signed)
Subjective:  Deborah Alexander is a 40 y.o. G2P0010 at 6316w1d being seen today for ongoing prenatal care.  She is currently monitored for the following issues for this high-risk pregnancy and has Supervision of high-risk pregnancy; Advanced maternal age, antepartum; Preexisting diabetes complicating pregnancy, antepartum; and Placenta previa antepartum on her problem list.  Patient reports no complaints.  Contractions: Irregular. Vag. Bleeding: None.  Movement: Present. Denies leaking of fluid.   The following portions of the patient's history were reviewed and updated as appropriate: allergies, current medications, past family history, past medical history, past social history, past surgical history and problem list. Problem list updated.  Objective:   Filed Vitals:   09/03/15 0923  Weight: 200 lb (90.719 kg)    Fetal Status: Fetal Heart Rate (bpm): NST   Movement: Present  Presentation: Vertex  General:  Alert, oriented and cooperative. Patient is in no acute distress.  Skin: Skin is warm and dry. No rash noted.   Cardiovascular: Normal heart rate noted  Respiratory: Normal respiratory effort, no problems with respiration noted  Abdomen: Soft, gravid, appropriate for gestational age. Pain/Pressure: Present     Pelvic: Vag. Bleeding: None Vag D/C Character: Thin   Cervical exam performed        Extremities: Normal range of motion.  Edema: Trace  Mental Status: Normal mood and affect. Normal behavior. Normal judgment and thought content.   NST reviewed and reactive. FBS 86-87 2 hour pp 99-130 Assessment and Plan:  Pregnancy: G2P0010 at 6916w1d  1. Supervision of high-risk pregnancy, third trimester - Culture, beta strep (group b only) - GC/Chlamydia probe amp ([place])not at [facility]  2. Preexisting diabetes complicating pregnancy, antepartum, third trimester Continue glyburide, ASA, diet - Fetal nonstress test - Amniotic fluid index  Term labor symptoms and general obstetric  precautions including but not limited to vaginal bleeding, contractions, leaking of fluid and fetal movement were reviewed in detail with the patient. Please refer to After Visit Summary for other counseling recommendations.  Return in 1 week (on 09/10/2015).   Reva Boresanya S Pratt, MD

## 2015-09-06 LAB — GC/CHLAMYDIA PROBE AMP (~~LOC~~) NOT AT ARMC
CHLAMYDIA, DNA PROBE: NEGATIVE
Neisseria Gonorrhea: NEGATIVE

## 2015-09-07 ENCOUNTER — Ambulatory Visit (INDEPENDENT_AMBULATORY_CARE_PROVIDER_SITE_OTHER): Payer: Federal, State, Local not specified - PPO | Admitting: *Deleted

## 2015-09-07 VITALS — BP 127/85 | HR 98 | Wt 200.0 lb

## 2015-09-07 DIAGNOSIS — O24410 Gestational diabetes mellitus in pregnancy, diet controlled: Secondary | ICD-10-CM

## 2015-09-08 ENCOUNTER — Encounter: Payer: Self-pay | Admitting: *Deleted

## 2015-09-08 ENCOUNTER — Encounter: Payer: Self-pay | Admitting: Family Medicine

## 2015-09-08 DIAGNOSIS — O99820 Streptococcus B carrier state complicating pregnancy: Secondary | ICD-10-CM | POA: Insufficient documentation

## 2015-09-08 LAB — CULTURE, BETA STREP (GROUP B ONLY)

## 2015-09-10 ENCOUNTER — Ambulatory Visit (INDEPENDENT_AMBULATORY_CARE_PROVIDER_SITE_OTHER): Payer: Federal, State, Local not specified - PPO | Admitting: Family Medicine

## 2015-09-10 VITALS — BP 109/73 | HR 116 | Wt 199.0 lb

## 2015-09-10 DIAGNOSIS — O24913 Unspecified diabetes mellitus in pregnancy, third trimester: Secondary | ICD-10-CM | POA: Diagnosis not present

## 2015-09-10 DIAGNOSIS — O24313 Unspecified pre-existing diabetes mellitus in pregnancy, third trimester: Secondary | ICD-10-CM

## 2015-09-10 DIAGNOSIS — O0993 Supervision of high risk pregnancy, unspecified, third trimester: Secondary | ICD-10-CM

## 2015-09-10 NOTE — Patient Instructions (Signed)
Third Trimester of Pregnancy The third trimester is from week 29 through week 42, months 7 through 9. The third trimester is a time when the fetus is growing rapidly. At the end of the ninth month, the fetus is about 20 inches in length and weighs 6-10 pounds.  BODY CHANGES Your body goes through many changes during pregnancy. The changes vary from woman to woman.   Your weight will continue to increase. You can expect to gain 25-35 pounds (11-16 kg) by the end of the pregnancy.  You may begin to get stretch marks on your hips, abdomen, and breasts.  You may urinate more often because the fetus is moving lower into your pelvis and pressing on your bladder.  You may develop or continue to have heartburn as a result of your pregnancy.  You may develop constipation because certain hormones are causing the muscles that push waste through your intestines to slow down.  You may develop hemorrhoids or swollen, bulging veins (varicose veins).  You may have pelvic pain because of the weight gain and pregnancy hormones relaxing your joints between the bones in your pelvis. Backaches may result from overexertion of the muscles supporting your posture.  You may have changes in your hair. These can include thickening of your hair, rapid growth, and changes in texture. Some women also have hair loss during or after pregnancy, or hair that feels dry or thin. Your hair will most likely return to normal after your baby is born.  Your breasts will continue to grow and be tender. A yellow discharge may leak from your breasts called colostrum.  Your belly button may stick out.  You may feel short of breath because of your expanding uterus.  You may notice the fetus "dropping," or moving lower in your abdomen.  You may have a bloody mucus discharge. This usually occurs a few days to a week before labor begins.  Your cervix becomes thin and soft (effaced) near your due date. WHAT TO EXPECT AT YOUR  PRENATAL EXAMS  You will have prenatal exams every 2 weeks until week 36. Then, you will have weekly prenatal exams. During a routine prenatal visit:  You will be weighed to make sure you and the fetus are growing normally.  Your blood pressure is taken.  Your abdomen will be measured to track your baby's growth.  The fetal heartbeat will be listened to.  Any test results from the previous visit will be discussed.  You may have a cervical check near your due date to see if you have effaced. At around 36 weeks, your caregiver will check your cervix. At the same time, your caregiver will also perform a test on the secretions of the vaginal tissue. This test is to determine if a type of bacteria, Group B streptococcus, is present. Your caregiver will explain this further. Your caregiver may ask you:  What your birth plan is.  How you are feeling.  If you are feeling the baby move.  If you have had any abnormal symptoms, such as leaking fluid, bleeding, severe headaches, or abdominal cramping.  If you are using any tobacco products, including cigarettes, chewing tobacco, and electronic cigarettes.  If you have any questions. Other tests or screenings that may be performed during your third trimester include:  Blood tests that check for low iron levels (anemia).  Fetal testing to check the health, activity level, and growth of the fetus. Testing is done if you have certain medical conditions or if   there are problems during the pregnancy.  HIV (human immunodeficiency virus) testing. If you are at high risk, you may be screened for HIV during your third trimester of pregnancy. FALSE LABOR You may feel small, irregular contractions that eventually go away. These are called Braxton Hicks contractions, or false labor. Contractions may last for hours, days, or even weeks before true labor sets in. If contractions come at regular intervals, intensify, or become painful, it is best to be seen  by your caregiver.  SIGNS OF LABOR   Menstrual-like cramps.  Contractions that are 5 minutes apart or less.  Contractions that start on the top of the uterus and spread down to the lower abdomen and back.  A sense of increased pelvic pressure or back pain.  A watery or bloody mucus discharge that comes from the vagina. If you have any of these signs before the 37th week of pregnancy, call your caregiver right away. You need to go to the hospital to get checked immediately. HOME CARE INSTRUCTIONS   Avoid all smoking, herbs, alcohol, and unprescribed drugs. These chemicals affect the formation and growth of the baby.  Do not use any tobacco products, including cigarettes, chewing tobacco, and electronic cigarettes. If you need help quitting, ask your health care provider. You may receive counseling support and other resources to help you quit.  Follow your caregiver's instructions regarding medicine use. There are medicines that are either safe or unsafe to take during pregnancy.  Exercise only as directed by your caregiver. Experiencing uterine cramps is a good sign to stop exercising.  Continue to eat regular, healthy meals.  Wear a good support bra for breast tenderness.  Do not use hot tubs, steam rooms, or saunas.  Wear your seat belt at all times when driving.  Avoid raw meat, uncooked cheese, cat litter boxes, and soil used by cats. These carry germs that can cause birth defects in the baby.  Take your prenatal vitamins.  Take 1500-2000 mg of calcium daily starting at the 20th week of pregnancy until you deliver your baby.  Try taking a stool softener (if your caregiver approves) if you develop constipation. Eat more high-fiber foods, such as fresh vegetables or fruit and whole grains. Drink plenty of fluids to keep your urine clear or pale yellow.  Take warm sitz baths to soothe any pain or discomfort caused by hemorrhoids. Use hemorrhoid cream if your caregiver  approves.  If you develop varicose veins, wear support hose. Elevate your feet for 15 minutes, 3-4 times a day. Limit salt in your diet.  Avoid heavy lifting, wear low heal shoes, and practice good posture.  Rest a lot with your legs elevated if you have leg cramps or low back pain.  Visit your dentist if you have not gone during your pregnancy. Use a soft toothbrush to brush your teeth and be gentle when you floss.  A sexual relationship may be continued unless your caregiver directs you otherwise.  Do not travel far distances unless it is absolutely necessary and only with the approval of your caregiver.  Take prenatal classes to understand, practice, and ask questions about the labor and delivery.  Make a trial run to the hospital.  Pack your hospital bag.  Prepare the baby's nursery.  Continue to go to all your prenatal visits as directed by your caregiver. SEEK MEDICAL CARE IF:  You are unsure if you are in labor or if your water has broken.  You have dizziness.  You have   mild pelvic cramps, pelvic pressure, or nagging pain in your abdominal area.  You have persistent nausea, vomiting, or diarrhea.  You have a bad smelling vaginal discharge.  You have pain with urination. SEEK IMMEDIATE MEDICAL CARE IF:   You have a fever.  You are leaking fluid from your vagina.  You have spotting or bleeding from your vagina.  You have severe abdominal cramping or pain.  You have rapid weight loss or gain.  You have shortness of breath with chest pain.  You notice sudden or extreme swelling of your face, hands, ankles, feet, or legs.  You have not felt your baby move in over an hour.  You have severe headaches that do not go away with medicine.  You have vision changes.   This information is not intended to replace advice given to you by your health care provider. Make sure you discuss any questions you have with your health care provider.   Document Released:  04/11/2001 Document Revised: 05/08/2014 Document Reviewed: 06/18/2012 Elsevier Interactive Patient Education 2016 [employer]  Breastfeeding Deciding to breastfeed is one of the best choices you can make for you and your baby. A change in hormones during pregnancy causes your breast tissue to grow and increases the number and size of your milk ducts. These hormones also allow proteins, sugars, and fats from your blood supply to make breast milk in your milk-producing glands. Hormones prevent breast milk from being released before your baby is born as well as prompt milk flow after birth. Once breastfeeding has begun, thoughts of your baby, as well as his or her sucking or crying, can stimulate the release of milk from your milk-producing glands.  BENEFITS OF BREASTFEEDING For Your Baby  Your first milk (colostrum) helps your baby's digestive system function better.  There are antibodies in your milk that help your baby fight off infections.  Your baby has a lower incidence of asthma, allergies, and sudden infant death syndrome.  The nutrients in breast milk are better for your baby than infant formulas and are designed uniquely for your baby's needs.  Breast milk improves your baby's brain development.  Your baby is less likely to develop other conditions, such as childhood obesity, asthma, or type 2 diabetes mellitus. For You  Breastfeeding helps to create a very special bond between you and your baby.  Breastfeeding is convenient. Breast milk is always available at the correct temperature and costs nothing.  Breastfeeding helps to burn calories and helps you lose the weight gained during pregnancy.  Breastfeeding makes your uterus contract to its prepregnancy size faster and slows bleeding (lochia) after you give birth.   Breastfeeding helps to lower your risk of developing type 2 diabetes mellitus, osteoporosis, and breast or ovarian cancer later in life. SIGNS THAT YOUR BABY IS  HUNGRY Early Signs of Hunger  Increased alertness or activity.  Stretching.  Movement of the head from side to side.  Movement of the head and opening of the mouth when the corner of the mouth or cheek is stroked (rooting).  Increased sucking sounds, smacking lips, cooing, sighing, or squeaking.  Hand-to-mouth movements.  Increased sucking of fingers or hands. Late Signs of Hunger  Fussing.  Intermittent crying. Extreme Signs of Hunger Signs of extreme hunger will require calming and consoling before your baby will be able to breastfeed successfully. Do not wait for the following signs of extreme hunger to occur before you initiate breastfeeding:  Restlessness.  A loud, strong cry.  Screaming.   BREASTFEEDING BASICS Breastfeeding Initiation  Find a comfortable place to sit or lie down, with your neck and back well supported.  Place a pillow or rolled up blanket under your baby to bring him or her to the level of your breast (if you are seated). Nursing pillows are specially designed to help support your arms and your baby while you breastfeed.  Make sure that your baby's abdomen is facing your abdomen.  Gently massage your breast. With your fingertips, massage from your chest wall toward your nipple in a circular motion. This encourages milk flow. You may need to continue this action during the feeding if your milk flows slowly.  Support your breast with 4 fingers underneath and your thumb above your nipple. Make sure your fingers are well away from your nipple and your baby's mouth.  Stroke your baby's lips gently with your finger or nipple.  When your baby's mouth is open wide enough, quickly bring your baby to your breast, placing your entire nipple and as much of the colored area around your nipple (areola) as possible into your baby's mouth.  More areola should be visible above your baby's upper lip than below the lower lip.  Your baby's tongue should be between his  or her lower gum and your breast.  Ensure that your baby's mouth is correctly positioned around your nipple (latched). Your baby's lips should create a seal on your breast and be turned out (everted).  It is common for your baby to suck about 2-3 minutes in order to start the flow of breast milk. Latching Teaching your baby how to latch on to your breast properly is very important. An improper latch can cause nipple pain and decreased milk supply for you and poor weight gain in your baby. Also, if your baby is not latched onto your nipple properly, he or she may swallow some air during feeding. This can make your baby fussy. Burping your baby when you switch breasts during the feeding can help to get rid of the air. However, teaching your baby to latch on properly is still the best way to prevent fussiness from swallowing air while breastfeeding. Signs that your baby has successfully latched on to your nipple:  Silent tugging or silent sucking, without causing you pain.  Swallowing heard between every 3-4 sucks.  Muscle movement above and in front of his or her ears while sucking. Signs that your baby has not successfully latched on to nipple:  Sucking sounds or smacking sounds from your baby while breastfeeding.  Nipple pain. If you think your baby has not latched on correctly, slip your finger into the corner of your baby's mouth to break the suction and place it between your baby's gums. Attempt breastfeeding initiation again. Signs of Successful Breastfeeding Signs from your baby:  A gradual decrease in the number of sucks or complete cessation of sucking.  Falling asleep.  Relaxation of his or her body.  Retention of a small amount of milk in his or her mouth.  Letting go of your breast by himself or herself. Signs from you:  Breasts that have increased in firmness, weight, and size 1-3 hours after feeding.  Breasts that are softer immediately after  breastfeeding.  Increased milk volume, as well as a change in milk consistency and color by the fifth day of breastfeeding.  Nipples that are not sore, cracked, or bleeding. Signs That Your Baby is Getting Enough Milk  Wetting at least 3 diapers in a 24-hour period.   The urine should be clear and pale yellow by age 5 days.  At least 3 stools in a 24-hour period by age 5 days. The stool should be soft and yellow.  At least 3 stools in a 24-hour period by age 7 days. The stool should be seedy and yellow.  No loss of weight greater than 10% of birth weight during the first 3 days of age.  Average weight gain of 4-7 ounces (113-198 g) per week after age 4 days.  Consistent daily weight gain by age 5 days, without weight loss after the age of 2 weeks. After a feeding, your baby may spit up a small amount. This is common. BREASTFEEDING FREQUENCY AND DURATION Frequent feeding will help you make more milk and can prevent sore nipples and breast engorgement. Breastfeed when you feel the need to reduce the fullness of your breasts or when your baby shows signs of hunger. This is called "breastfeeding on demand." Avoid introducing a pacifier to your baby while you are working to establish breastfeeding (the first 4-6 weeks after your baby is born). After this time you may choose to use a pacifier. Research has shown that pacifier use during the first year of a baby's life decreases the risk of sudden infant death syndrome (SIDS). Allow your baby to feed on each breast as long as he or she wants. Breastfeed until your baby is finished feeding. When your baby unlatches or falls asleep while feeding from the first breast, offer the second breast. Because newborns are often sleepy in the first few weeks of life, you may need to awaken your baby to get him or her to feed. Breastfeeding times will vary from baby to baby. However, the following rules can serve as a guide to help you ensure that your baby is  properly fed:  Newborns (babies 4 weeks of age or younger) may breastfeed every 1-3 hours.  Newborns should not go longer than 3 hours during the day or 5 hours during the night without breastfeeding.  You should breastfeed your baby a minimum of 8 times in a 24-hour period until you begin to introduce solid foods to your baby at around 6 months of age. BREAST MILK PUMPING Pumping and storing breast milk allows you to ensure that your baby is exclusively fed your breast milk, even at times when you are unable to breastfeed. This is especially important if you are going back to work while you are still breastfeeding or when you are not able to be present during feedings. Your lactation consultant can give you guidelines on how long it is safe to store breast milk. A breast pump is a machine that allows you to pump milk from your breast into a sterile bottle. The pumped breast milk can then be stored in a refrigerator or freezer. Some breast pumps are operated by hand, while others use electricity. Ask your lactation consultant which type will work best for you. Breast pumps can be purchased, but some hospitals and breastfeeding support groups lease breast pumps on a monthly basis. A lactation consultant can teach you how to hand express breast milk, if you prefer not to use a pump. CARING FOR YOUR BREASTS WHILE YOU BREASTFEED Nipples can become dry, cracked, and sore while breastfeeding. The following recommendations can help keep your breasts moisturized and healthy:  Avoid using soap on your nipples.  Wear a supportive bra. Although not required, special nursing bras and tank tops are designed to allow access to your   breasts for breastfeeding without taking off your entire bra or top. Avoid wearing underwire-style bras or extremely tight bras.  Air dry your nipples for 3-4minutes after each feeding.  Use only cotton bra pads to absorb leaked breast milk. Leaking of breast milk between feedings  is normal.  Use lanolin on your nipples after breastfeeding. Lanolin helps to maintain your skin's normal moisture barrier. If you use pure lanolin, you do not need to wash it off before feeding your baby again. Pure lanolin is not toxic to your baby. You may also hand express a few drops of breast milk and gently massage that milk into your nipples and allow the milk to air dry. In the first few weeks after giving birth, some women experience extremely full breasts (engorgement). Engorgement can make your breasts feel heavy, warm, and tender to the touch. Engorgement peaks within 3-5 days after you give birth. The following recommendations can help ease engorgement:  Completely empty your breasts while breastfeeding or pumping. You may want to start by applying warm, moist heat (in the shower or with warm water-soaked hand towels) just before feeding or pumping. This increases circulation and helps the milk flow. If your baby does not completely empty your breasts while breastfeeding, pump any extra milk after he or she is finished.  Wear a snug bra (nursing or regular) or tank top for 1-2 days to signal your body to slightly decrease milk production.  Apply ice packs to your breasts, unless this is too uncomfortable for you.  Make sure that your baby is latched on and positioned properly while breastfeeding. If engorgement persists after 48 hours of following these recommendations, contact your health care provider or a lactation consultant. OVERALL HEALTH CARE RECOMMENDATIONS WHILE BREASTFEEDING  Eat healthy foods. Alternate between meals and snacks, eating 3 of each per day. Because what you eat affects your breast milk, some of the foods may make your baby more irritable than usual. Avoid eating these foods if you are sure that they are negatively affecting your baby.  Drink milk, fruit juice, and water to satisfy your thirst (about 10 glasses a day).  Rest often, relax, and continue to take  your prenatal vitamins to prevent fatigue, stress, and anemia.  Continue breast self-awareness checks.  Avoid chewing and smoking tobacco. Chemicals from cigarettes that pass into breast milk and exposure to secondhand smoke may harm your baby.  Avoid alcohol and drug use, including marijuana. Some medicines that may be harmful to your baby can pass through breast milk. It is important to ask your health care provider before taking any medicine, including all over-the-counter and prescription medicine as well as vitamin and herbal supplements. It is possible to become pregnant while breastfeeding. If birth control is desired, ask your health care provider about options that will be safe for your baby. SEEK MEDICAL CARE IF:  You feel like you want to stop breastfeeding or have become frustrated with breastfeeding.  You have painful breasts or nipples.  Your nipples are cracked or bleeding.  Your breasts are red, tender, or warm.  You have a swollen area on either breast.  You have a fever or chills.  You have nausea or vomiting.  You have drainage other than breast milk from your nipples.  Your breasts do not become full before feedings by the fifth day after you give birth.  You feel sad and depressed.  Your baby is too sleepy to eat well.  Your baby is having trouble sleeping.     Your baby is wetting less than 3 diapers in a 24-hour period.  Your baby has less than 3 stools in a 24-hour period.  Your baby's skin or the white part of his or her eyes becomes yellow.   Your baby is not gaining weight by 5 days of age. SEEK IMMEDIATE MEDICAL CARE IF:  Your baby is overly tired (lethargic) and does not want to wake up and feed.  Your baby develops an unexplained fever.   This information is not intended to replace advice given to you by your health care provider. Make sure you discuss any questions you have with your health care provider.   Document Released: 04/17/2005  Document Revised: 01/06/2015 Document Reviewed: 10/09/2012 Elsevier Interactive Patient Education 2016 [employer]  

## 2015-09-10 NOTE — Progress Notes (Signed)
Subjective:  Deborah Alexander is a 40 y.o. G2P0010 at 5965w1d being seen today for ongoing prenatal care.  She is currently monitored for the following issues for this high-risk pregnancy and has Supervision of high-risk pregnancy; Advanced maternal age, antepartum; Preexisting diabetes complicating pregnancy, antepartum; Placenta previa antepartum; and Group B Streptococcus carrier, +RV culture, currently pregnant on her problem list.  Patient reports no complaints.  Contractions: Not present. Vag. Bleeding: None.  Movement: Present. Denies leaking of fluid.   The following portions of the patient's history were reviewed and updated as appropriate: allergies, current medications, past family history, past medical history, past social history, past surgical history and problem list. Problem list updated.  Objective:   Filed Vitals:   09/10/15 1020  BP: 109/73  Pulse: 116  Weight: 199 lb (90.266 kg)    Fetal Status: Fetal Heart Rate (bpm): 147   Movement: Present     General:  Alert, oriented and cooperative. Patient is in no acute distress.  Skin: Skin is warm and dry. No rash noted.   Cardiovascular: Normal heart rate noted  Respiratory: Normal respiratory effort, no problems with respiration noted  Abdomen: Soft, gravid, appropriate for gestational age. Pain/Pressure: Present     Pelvic: Vag. Bleeding: None Vag D/C Character: Thin   Cervical exam deferred        Extremities: Normal range of motion.  Edema: Trace  Mental Status: Normal mood and affect. Normal behavior. Normal judgment and thought content.   Urinalysis: Urine Protein: Negative Urine Glucose: Negative NST reviewed and reactive. FBS 84-103 (1 over 95) 2 hr pp 56-149 (5 of 18 out of range) Assessment and Plan:  Pregnancy: G2P0010 at 8565w1d  1. Preexisting diabetes complicating pregnancy, antepartum, third trimester Continue glyburide Stop ASA - Amniotic fluid index with NST  2. Supervision of high-risk  pregnancy, third trimester Continue prenatal care.   Term labor symptoms and general obstetric precautions including but not limited to vaginal bleeding, contractions, leaking of fluid and fetal movement were reviewed in detail with the patient. Please refer to After Visit Summary for other counseling recommendations.  Return in 1 week (on 09/17/2015) for OB visit and NST, NST only in 3-4 days.   Reva Boresanya S Gurinder Toral, MD

## 2015-09-14 ENCOUNTER — Ambulatory Visit (INDEPENDENT_AMBULATORY_CARE_PROVIDER_SITE_OTHER): Payer: Federal, State, Local not specified - PPO | Admitting: Obstetrics and Gynecology

## 2015-09-14 ENCOUNTER — Telehealth (HOSPITAL_COMMUNITY): Payer: Self-pay | Admitting: *Deleted

## 2015-09-14 DIAGNOSIS — O0993 Supervision of high risk pregnancy, unspecified, third trimester: Secondary | ICD-10-CM

## 2015-09-14 DIAGNOSIS — O24913 Unspecified diabetes mellitus in pregnancy, third trimester: Secondary | ICD-10-CM

## 2015-09-14 DIAGNOSIS — O24313 Unspecified pre-existing diabetes mellitus in pregnancy, third trimester: Secondary | ICD-10-CM

## 2015-09-14 NOTE — Telephone Encounter (Signed)
Preadmission screen  

## 2015-09-14 NOTE — Progress Notes (Signed)
Patient ID: Deborah CanningCarole Ryan-Martinez, female   DOB: 1975-07-15, 40 y.o.   MRN: [id number] 09/14/15 NST reviewed and reactive

## 2015-09-17 ENCOUNTER — Ambulatory Visit (INDEPENDENT_AMBULATORY_CARE_PROVIDER_SITE_OTHER): Payer: Federal, State, Local not specified - PPO | Admitting: Obstetrics & Gynecology

## 2015-09-17 VITALS — BP 123/82 | Wt 200.0 lb

## 2015-09-17 DIAGNOSIS — O09513 Supervision of elderly primigravida, third trimester: Secondary | ICD-10-CM

## 2015-09-17 DIAGNOSIS — O24415 Gestational diabetes mellitus in pregnancy, controlled by oral hypoglycemic drugs: Secondary | ICD-10-CM | POA: Diagnosis not present

## 2015-09-17 DIAGNOSIS — Z22330 Carrier of Group B streptococcus: Secondary | ICD-10-CM

## 2015-09-17 DIAGNOSIS — O24313 Unspecified pre-existing diabetes mellitus in pregnancy, third trimester: Secondary | ICD-10-CM

## 2015-09-17 DIAGNOSIS — O99820 Streptococcus B carrier state complicating pregnancy: Secondary | ICD-10-CM

## 2015-09-17 DIAGNOSIS — O0993 Supervision of high risk pregnancy, unspecified, third trimester: Secondary | ICD-10-CM

## 2015-09-17 NOTE — Progress Notes (Signed)
Subjective:  Deborah Alexander is a 40 y.o. G2P0010 at 1954w1d being seen today for ongoing prenatal care.  She is currently monitored for the following issues for this high-risk pregnancy and has Supervision of high-risk pregnancy; Advanced maternal age, antepartum; Preexisting diabetes complicating pregnancy, antepartum; Placenta previa antepartum; and Group B Streptococcus carrier, +RV culture, currently pregnant on her problem list.  Patient reports no complaints.  Contractions: Irregular. Vag. Bleeding: None.  Movement: Present. Denies leaking of fluid.   The following portions of the patient's history were reviewed and updated as appropriate: allergies, current medications, past family history, past medical history, past social history, past surgical history and problem list. Problem list updated.  Objective:   Filed Vitals:   09/17/15 1006  BP: 123/82    Fetal Status: Fetal Heart Rate (bpm): NST   Movement: Present  Presentation: Vertex  General:  Alert, oriented and cooperative. Patient is in no acute distress.  Skin: Skin is warm and dry. No rash noted.   Cardiovascular: Normal heart rate noted  Respiratory: Normal respiratory effort, no problems with respiration noted  Abdomen: Soft, gravid, appropriate for gestational age. Pain/Pressure: Present     Pelvic: Vag. Bleeding: None Vag D/C Character: Thin   Cervical exam deferred        Extremities: Normal range of motion.  Edema: Trace  Mental Status: Normal mood and affect. Normal behavior. Normal judgment and thought content.   Urinalysis:      Assessment and Plan:  Pregnancy: G2P0010 at 2654w1d  1. Supervision of high-risk pregnancy, third trimester  - Fetal nonstress test - Amniotic fluid index  2. Advanced maternal age, primigravida, antepartum, third trimester   3. Preexisting diabetes complicating pregnancy, antepartum, third trimester - IOL scheduled for 39 weeks - Continue twice weekly testing  Term labor  symptoms and general obstetric precautions including but not limited to vaginal bleeding, contractions, leaking of fluid and fetal movement were reviewed in detail with the patient. Please refer to After Visit Summary for other counseling recommendations.  No Follow-up on file.   Allie BossierMyra C Maclaine Ahola, MD

## 2015-09-18 ENCOUNTER — Inpatient Hospital Stay (HOSPITAL_COMMUNITY): Payer: Federal, State, Local not specified - PPO | Admitting: Anesthesiology

## 2015-09-18 ENCOUNTER — Encounter (HOSPITAL_COMMUNITY)
Admission: AD | Disposition: A | Discharge status: Home / Self Care | Payer: Self-pay | Source: Ambulatory Visit | Attending: Obstetrics & Gynecology

## 2015-09-18 ENCOUNTER — Encounter (HOSPITAL_COMMUNITY): Payer: Self-pay | Admitting: *Deleted

## 2015-09-18 ENCOUNTER — Inpatient Hospital Stay (HOSPITAL_COMMUNITY)
Admission: AD | Admit: 2015-09-18 | Discharge: 2015-09-21 | DRG: 766 | Disposition: A | Discharge status: Home / Self Care | Payer: Federal, State, Local not specified - PPO | Source: Ambulatory Visit | Attending: Obstetrics & Gynecology | Admitting: Obstetrics & Gynecology

## 2015-09-18 DIAGNOSIS — O99824 Streptococcus B carrier state complicating childbirth: Secondary | ICD-10-CM | POA: Diagnosis present

## 2015-09-18 DIAGNOSIS — O0993 Supervision of high risk pregnancy, unspecified, third trimester: Secondary | ICD-10-CM

## 2015-09-18 DIAGNOSIS — O4202 Full-term premature rupture of membranes, onset of labor within 24 hours of rupture: Secondary | ICD-10-CM | POA: Diagnosis present

## 2015-09-18 DIAGNOSIS — Z3A38 38 weeks gestation of pregnancy: Secondary | ICD-10-CM

## 2015-09-18 DIAGNOSIS — K219 Gastro-esophageal reflux disease without esophagitis: Secondary | ICD-10-CM | POA: Diagnosis present

## 2015-09-18 DIAGNOSIS — O9962 Diseases of the digestive system complicating childbirth: Secondary | ICD-10-CM | POA: Diagnosis present

## 2015-09-18 DIAGNOSIS — O24425 Gestational diabetes mellitus in childbirth, controlled by oral hypoglycemic drugs: Secondary | ICD-10-CM | POA: Diagnosis present

## 2015-09-18 DIAGNOSIS — O24420 Gestational diabetes mellitus in childbirth, diet controlled: Secondary | ICD-10-CM

## 2015-09-18 DIAGNOSIS — O09519 Supervision of elderly primigravida, unspecified trimester: Secondary | ICD-10-CM

## 2015-09-18 DIAGNOSIS — IMO0001 Reserved for inherently not codable concepts without codable children: Secondary | ICD-10-CM | POA: Insufficient documentation

## 2015-09-18 DIAGNOSIS — Z8249 Family history of ischemic heart disease and other diseases of the circulatory system: Secondary | ICD-10-CM

## 2015-09-18 DIAGNOSIS — O4400 Placenta previa specified as without hemorrhage, unspecified trimester: Secondary | ICD-10-CM | POA: Diagnosis present

## 2015-09-18 DIAGNOSIS — Z98891 History of uterine scar from previous surgery: Secondary | ICD-10-CM

## 2015-09-18 DIAGNOSIS — O09513 Supervision of elderly primigravida, third trimester: Secondary | ICD-10-CM

## 2015-09-18 DIAGNOSIS — Z833 Family history of diabetes mellitus: Secondary | ICD-10-CM

## 2015-09-18 DIAGNOSIS — O24313 Unspecified pre-existing diabetes mellitus in pregnancy, third trimester: Secondary | ICD-10-CM

## 2015-09-18 DIAGNOSIS — Z87891 Personal history of nicotine dependence: Secondary | ICD-10-CM | POA: Diagnosis not present

## 2015-09-18 DIAGNOSIS — O24319 Unspecified pre-existing diabetes mellitus in pregnancy, unspecified trimester: Secondary | ICD-10-CM | POA: Diagnosis present

## 2015-09-18 DIAGNOSIS — O0990 Supervision of high risk pregnancy, unspecified, unspecified trimester: Secondary | ICD-10-CM

## 2015-09-18 DIAGNOSIS — O4403 Placenta previa specified as without hemorrhage, third trimester: Secondary | ICD-10-CM

## 2015-09-18 DIAGNOSIS — O99820 Streptococcus B carrier state complicating pregnancy: Secondary | ICD-10-CM

## 2015-09-18 LAB — CBC
HCT: 38 % (ref 36.0–46.0)
Hemoglobin: 13.4 g/dL (ref 12.0–15.0)
MCH: 29.5 pg (ref 26.0–34.0)
MCHC: 35.3 g/dL (ref 30.0–36.0)
MCV: 83.5 fL (ref 78.0–100.0)
PLATELETS: 246 10*3/uL (ref 150–400)
RBC: 4.55 MIL/uL (ref 3.87–5.11)
RDW: 13.8 % (ref 11.5–15.5)
WBC: 5.7 10*3/uL (ref 4.0–10.5)

## 2015-09-18 LAB — TYPE AND SCREEN
ABO/RH(D): O POS
ANTIBODY SCREEN: NEGATIVE

## 2015-09-18 LAB — AMNISURE RUPTURE OF MEMBRANE (ROM) NOT AT ARMC: Amnisure ROM: POSITIVE

## 2015-09-18 LAB — GLUCOSE, CAPILLARY
GLUCOSE-CAPILLARY: 121 mg/dL — AB (ref 65–99)
GLUCOSE-CAPILLARY: 71 mg/dL (ref 65–99)

## 2015-09-18 LAB — ABO/RH: ABO/RH(D): O POS

## 2015-09-18 SURGERY — Surgical Case
Anesthesia: Epidural

## 2015-09-18 MED ORDER — MAGNESIUM HYDROXIDE 400 MG/5ML PO SUSP: 30.0000 mL | ORAL | Status: DC | PRN

## 2015-09-18 MED ORDER — EPHEDRINE 5 MG/ML INJ: 10.0000 mg | INTRAVENOUS | Status: DC | PRN

## 2015-09-18 MED ORDER — LACTATED RINGERS IV SOLN
INTRAVENOUS | Status: DC
  Administered 2015-09-18: 15:00:00 via INTRAUTERINE

## 2015-09-18 MED ORDER — TETANUS-DIPHTH-ACELL PERTUSSIS 5-2.5-18.5 LF-MCG/0.5 IM SUSP: 0.5000 mL | Freq: Once | INTRAMUSCULAR | Status: DC

## 2015-09-18 MED ORDER — BUPIVACAINE HCL (PF) 0.5 % IJ SOLN
INTRAMUSCULAR | Status: AC
  Filled 2015-09-18: qty 30

## 2015-09-18 MED ORDER — DIPHENHYDRAMINE HCL 25 MG PO CAPS: 25.0000 mg | ORAL_CAPSULE | Freq: Four times a day (QID) | ORAL | Status: DC | PRN

## 2015-09-18 MED ORDER — PHENYLEPHRINE 40 MCG/ML (10ML) SYRINGE FOR IV PUSH (FOR BLOOD PRESSURE SUPPORT): 80.0000 ug | PREFILLED_SYRINGE | INTRAVENOUS | Status: DC | PRN

## 2015-09-18 MED ORDER — BUPIVACAINE HCL (PF) 0.5 % IJ SOLN
INTRAMUSCULAR | Status: DC | PRN
  Administered 2015-09-18: 30 mL

## 2015-09-18 MED ORDER — DEXTROSE 5 % IV SOLN
5.0000 10*6.[IU] | Freq: Once | INTRAVENOUS | Status: AC
  Administered 2015-09-18: 5 10*6.[IU] via INTRAVENOUS
  Filled 2015-09-18: qty 5

## 2015-09-18 MED ORDER — SODIUM BICARBONATE 8.4 % IV SOLN
INTRAVENOUS | Status: DC | PRN
  Administered 2015-09-18 (×2): 5 mL via EPIDURAL

## 2015-09-18 MED ORDER — FENTANYL 2.5 MCG/ML BUPIVACAINE 1/10 % EPIDURAL INFUSION (WH - ANES)
14.0000 mL/h | INTRAMUSCULAR | Status: DC | PRN
  Administered 2015-09-18 (×2): 14 mL/h via EPIDURAL
  Filled 2015-09-18: qty 125

## 2015-09-18 MED ORDER — SCOPOLAMINE 1 MG/3DAYS TD PT72
1.0000 | MEDICATED_PATCH | Freq: Once | TRANSDERMAL | Status: DC
  Filled 2015-09-18: qty 1

## 2015-09-18 MED ORDER — CITRIC ACID-SODIUM CITRATE 334-500 MG/5ML PO SOLN
30.0000 mL | ORAL | Status: DC | PRN
  Administered 2015-09-18: 30 mL via ORAL
  Filled 2015-09-18: qty 15

## 2015-09-18 MED ORDER — PHENYLEPHRINE 40 MCG/ML (10ML) SYRINGE FOR IV PUSH (FOR BLOOD PRESSURE SUPPORT)
PREFILLED_SYRINGE | INTRAVENOUS | Status: AC
  Filled 2015-09-18: qty 10

## 2015-09-18 MED ORDER — SODIUM CHLORIDE 0.9 % IV SOLN
10000.0000 ug | INTRAVENOUS | Status: DC | PRN
  Administered 2015-09-18 (×3): 80 ug via INTRAVENOUS
  Administered 2015-09-18: 40 ug via INTRAVENOUS
  Administered 2015-09-18 (×2): 80 ug via INTRAVENOUS
  Administered 2015-09-18: 40 ug via INTRAVENOUS

## 2015-09-18 MED ORDER — DIPHENHYDRAMINE HCL 50 MG/ML IJ SOLN: 12.5000 mg | INTRAMUSCULAR | Status: DC | PRN

## 2015-09-18 MED ORDER — SCOPOLAMINE 1 MG/3DAYS TD PT72
MEDICATED_PATCH | TRANSDERMAL | Status: DC | PRN
  Administered 2015-09-18: 1 via TRANSDERMAL

## 2015-09-18 MED ORDER — OXYTOCIN 40 UNITS IN LACTATED RINGERS INFUSION - SIMPLE MED: 2.5000 [IU]/h | INTRAVENOUS | Status: DC

## 2015-09-18 MED ORDER — OXYCODONE-ACETAMINOPHEN 5-325 MG PO TABS
1.0000 | ORAL_TABLET | ORAL | Status: DC | PRN
Start: 2015-09-18 — End: 2015-09-21

## 2015-09-18 MED ORDER — CEFAZOLIN SODIUM-DEXTROSE 2-4 GM/100ML-% IV SOLN
INTRAVENOUS | Status: AC
  Filled 2015-09-18: qty 100

## 2015-09-18 MED ORDER — SCOPOLAMINE 1 MG/3DAYS TD PT72
MEDICATED_PATCH | TRANSDERMAL | Status: AC
  Filled 2015-09-18: qty 1

## 2015-09-18 MED ORDER — LACTATED RINGERS IV SOLN
INTRAVENOUS | Status: DC
  Administered 2015-09-19: 06:00:00 via INTRAVENOUS

## 2015-09-18 MED ORDER — MEPERIDINE HCL 25 MG/ML IJ SOLN
INTRAMUSCULAR | Status: DC | PRN
  Administered 2015-09-18 (×2): 12.5 mg via INTRAVENOUS

## 2015-09-18 MED ORDER — IBUPROFEN 600 MG PO TABS
600.0000 mg | ORAL_TABLET | Freq: Four times a day (QID) | ORAL | Status: DC
  Administered 2015-09-19 – 2015-09-21 (×9): 600 mg via ORAL
  Filled 2015-09-18 (×9): qty 1

## 2015-09-18 MED ORDER — SODIUM CHLORIDE 0.9% FLUSH: 3.0000 mL | INTRAVENOUS | Status: DC | PRN

## 2015-09-18 MED ORDER — FENTANYL CITRATE (PF) 100 MCG/2ML IJ SOLN
25.0000 ug | INTRAMUSCULAR | Status: DC | PRN
  Administered 2015-09-18: 50 ug via INTRAVENOUS

## 2015-09-18 MED ORDER — DEXAMETHASONE SODIUM PHOSPHATE 4 MG/ML IJ SOLN
INTRAMUSCULAR | Status: DC | PRN
  Administered 2015-09-18: 4 mg via INTRAVENOUS

## 2015-09-18 MED ORDER — CEFAZOLIN SODIUM-DEXTROSE 2-3 GM-% IV SOLR
INTRAVENOUS | Status: DC | PRN
  Administered 2015-09-18: 2 g via INTRAVENOUS

## 2015-09-18 MED ORDER — LACTATED RINGERS IV SOLN: 500.0000 mL | Freq: Once | INTRAVENOUS | Status: DC

## 2015-09-18 MED ORDER — WITCH HAZEL-GLYCERIN EX PADS: 1.0000 "application " | MEDICATED_PAD | CUTANEOUS | Status: DC | PRN

## 2015-09-18 MED ORDER — NALBUPHINE HCL 10 MG/ML IJ SOLN: 5.0000 mg | INTRAMUSCULAR | Status: DC | PRN

## 2015-09-18 MED ORDER — PROCHLORPERAZINE EDISYLATE 5 MG/ML IJ SOLN: 10.0000 mg | INTRAMUSCULAR | Status: DC | PRN

## 2015-09-18 MED ORDER — FLEET ENEMA 7-19 GM/118ML RE ENEM: 1.0000 | ENEMA | RECTAL | Status: DC | PRN

## 2015-09-18 MED ORDER — OXYCODONE-ACETAMINOPHEN 5-325 MG PO TABS: 1.0000 | ORAL_TABLET | ORAL | Status: DC | PRN

## 2015-09-18 MED ORDER — NALOXONE HCL 0.4 MG/ML IJ SOLN: 0.4000 mg | INTRAMUSCULAR | Status: DC | PRN

## 2015-09-18 MED ORDER — OXYTOCIN 10 UNIT/ML IJ SOLN
INTRAMUSCULAR | Status: AC
  Filled 2015-09-18: qty 4

## 2015-09-18 MED ORDER — NALBUPHINE HCL 10 MG/ML IJ SOLN: 5.0000 mg | Freq: Once | INTRAMUSCULAR | Status: DC | PRN

## 2015-09-18 MED ORDER — KETOROLAC TROMETHAMINE 30 MG/ML IJ SOLN: 30.0000 mg | Freq: Four times a day (QID) | INTRAMUSCULAR | Status: AC | PRN

## 2015-09-18 MED ORDER — LACTATED RINGERS IV SOLN: INTRAVENOUS | Status: DC

## 2015-09-18 MED ORDER — PHENYLEPHRINE 40 MCG/ML (10ML) SYRINGE FOR IV PUSH (FOR BLOOD PRESSURE SUPPORT)
80.0000 ug | PREFILLED_SYRINGE | INTRAVENOUS | Status: AC | PRN
  Administered 2015-09-18 (×3): 80 ug via INTRAVENOUS
  Filled 2015-09-18 (×2): qty 10

## 2015-09-18 MED ORDER — DIBUCAINE 1 % RE OINT: 1.0000 "application " | TOPICAL_OINTMENT | RECTAL | Status: DC | PRN

## 2015-09-18 MED ORDER — PRENATAL MULTIVITAMIN CH
1.0000 | ORAL_TABLET | Freq: Every day | ORAL | Status: DC
  Administered 2015-09-19 – 2015-09-21 (×3): 1 via ORAL
  Filled 2015-09-18 (×3): qty 1

## 2015-09-18 MED ORDER — ONDANSETRON HCL 4 MG/2ML IJ SOLN
4.0000 mg | Freq: Four times a day (QID) | INTRAMUSCULAR | Status: DC | PRN
  Administered 2015-09-18: 4 mg via INTRAVENOUS
  Filled 2015-09-18: qty 2

## 2015-09-18 MED ORDER — PHENYLEPHRINE 40 MCG/ML (10ML) SYRINGE FOR IV PUSH (FOR BLOOD PRESSURE SUPPORT)
80.0000 ug | PREFILLED_SYRINGE | INTRAVENOUS | Status: AC | PRN
  Administered 2015-09-18 (×3): 80 ug via INTRAVENOUS

## 2015-09-18 MED ORDER — ONDANSETRON HCL 4 MG/2ML IJ SOLN
INTRAMUSCULAR | Status: AC
  Filled 2015-09-18: qty 2

## 2015-09-18 MED ORDER — FENTANYL CITRATE (PF) 100 MCG/2ML IJ SOLN
INTRAMUSCULAR | Status: AC
  Filled 2015-09-18: qty 2

## 2015-09-18 MED ORDER — FERROUS SULFATE 325 (65 FE) MG PO TABS
325.0000 mg | ORAL_TABLET | Freq: Two times a day (BID) | ORAL | Status: DC
  Administered 2015-09-19 – 2015-09-21 (×4): 325 mg via ORAL
  Filled 2015-09-18 (×6): qty 1

## 2015-09-18 MED ORDER — MORPHINE SULFATE (PF) 0.5 MG/ML IJ SOLN
INTRAMUSCULAR | Status: AC
  Filled 2015-09-18: qty 10

## 2015-09-18 MED ORDER — MEPERIDINE HCL 25 MG/ML IJ SOLN: 6.2500 mg | INTRAMUSCULAR | Status: DC | PRN

## 2015-09-18 MED ORDER — ACETAMINOPHEN 10 MG/ML IV SOLN
1000.0000 mg | Freq: Once | INTRAVENOUS | Status: AC
  Filled 2015-09-18: qty 100

## 2015-09-18 MED ORDER — DIPHENHYDRAMINE HCL 25 MG PO CAPS: 25.0000 mg | ORAL_CAPSULE | ORAL | Status: DC | PRN

## 2015-09-18 MED ORDER — SIMETHICONE 80 MG PO CHEW
80.0000 mg | CHEWABLE_TABLET | ORAL | Status: DC
  Administered 2015-09-20 (×3): 80 mg via ORAL
  Filled 2015-09-18 (×2): qty 1

## 2015-09-18 MED ORDER — LACTATED RINGERS IV SOLN
500.0000 mL | INTRAVENOUS | Status: DC | PRN
  Administered 2015-09-18: 1000 mL via INTRAVENOUS
  Administered 2015-09-18: 500 mL via INTRAVENOUS

## 2015-09-18 MED ORDER — SIMETHICONE 80 MG PO CHEW
80.0000 mg | CHEWABLE_TABLET | ORAL | Status: DC | PRN
  Filled 2015-09-18: qty 1

## 2015-09-18 MED ORDER — MENTHOL 3 MG MT LOZG: 1.0000 | LOZENGE | OROMUCOSAL | Status: DC | PRN

## 2015-09-18 MED ORDER — LIDOCAINE HCL (PF) 1 % IJ SOLN
INTRAMUSCULAR | Status: DC | PRN
  Administered 2015-09-18 (×2): 4 mL via EPIDURAL

## 2015-09-18 MED ORDER — LIDOCAINE HCL (PF) 1 % IJ SOLN
30.0000 mL | INTRAMUSCULAR | Status: DC | PRN
Start: 2015-09-18 — End: 2015-09-18

## 2015-09-18 MED ORDER — LACTATED RINGERS IV SOLN
INTRAVENOUS | Status: DC | PRN
  Administered 2015-09-18 (×3): via INTRAVENOUS

## 2015-09-18 MED ORDER — OXYCODONE-ACETAMINOPHEN 5-325 MG PO TABS: 2.0000 | ORAL_TABLET | ORAL | Status: DC | PRN

## 2015-09-18 MED ORDER — PENICILLIN G POTASSIUM 5000000 UNITS IJ SOLR
2.5000 10*6.[IU] | INTRAVENOUS | Status: DC
  Administered 2015-09-18: 2.5 10*6.[IU] via INTRAVENOUS
  Filled 2015-09-18 (×3): qty 2.5

## 2015-09-18 MED ORDER — COCONUT OIL OIL: 1.0000 "application " | TOPICAL_OIL | Status: DC | PRN

## 2015-09-18 MED ORDER — OXYTOCIN BOLUS FROM INFUSION: 500.0000 mL | INTRAVENOUS | Status: DC

## 2015-09-18 MED ORDER — SENNOSIDES-DOCUSATE SODIUM 8.6-50 MG PO TABS
2.0000 | ORAL_TABLET | ORAL | Status: DC
  Administered 2015-09-20 (×2): 2 via ORAL
  Filled 2015-09-18 (×2): qty 2

## 2015-09-18 MED ORDER — ACETAMINOPHEN 325 MG PO TABS: 650.0000 mg | ORAL_TABLET | ORAL | Status: DC | PRN

## 2015-09-18 MED ORDER — ONDANSETRON HCL 4 MG/2ML IJ SOLN
INTRAMUSCULAR | Status: DC | PRN
  Administered 2015-09-18: 4 mg via INTRAVENOUS

## 2015-09-18 MED ORDER — MEASLES, MUMPS & RUBELLA VAC ~~LOC~~ INJ
0.5000 mL | INJECTION | Freq: Once | SUBCUTANEOUS | Status: DC
  Filled 2015-09-18: qty 0.5

## 2015-09-18 MED ORDER — MEPERIDINE HCL 25 MG/ML IJ SOLN
INTRAMUSCULAR | Status: AC
  Filled 2015-09-18: qty 1

## 2015-09-18 MED ORDER — OXYTOCIN 40 UNITS IN LACTATED RINGERS INFUSION - SIMPLE MED: 2.5000 [IU]/h | INTRAVENOUS | Status: AC

## 2015-09-18 MED ORDER — NALOXONE HCL 2 MG/2ML IJ SOSY
1.0000 ug/kg/h | PREFILLED_SYRINGE | INTRAVENOUS | Status: DC | PRN
  Filled 2015-09-18: qty 2

## 2015-09-18 MED ORDER — KETOROLAC TROMETHAMINE 30 MG/ML IJ SOLN
30.0000 mg | Freq: Four times a day (QID) | INTRAMUSCULAR | Status: AC | PRN
Start: 2015-09-18 — End: 2015-09-19

## 2015-09-18 MED ORDER — LACTATED RINGERS IV SOLN
500.0000 mL | Freq: Once | INTRAVENOUS | Status: AC
Start: 2015-09-18 — End: 2015-09-18
  Administered 2015-09-18: 500 mL via INTRAVENOUS

## 2015-09-18 MED ORDER — ONDANSETRON HCL 4 MG/2ML IJ SOLN
4.0000 mg | Freq: Three times a day (TID) | INTRAMUSCULAR | Status: DC | PRN
  Administered 2015-09-19: 4 mg via INTRAVENOUS
  Filled 2015-09-18 (×2): qty 2

## 2015-09-18 MED ORDER — OXYTOCIN 10 UNIT/ML IJ SOLN
40.0000 [IU] | INTRAVENOUS | Status: DC | PRN
  Administered 2015-09-18: 40 [IU] via INTRAVENOUS

## 2015-09-18 MED ORDER — MORPHINE SULFATE (PF) 0.5 MG/ML IJ SOLN
INTRAMUSCULAR | Status: DC | PRN
  Administered 2015-09-18: 4 mg via EPIDURAL

## 2015-09-18 MED ORDER — LACTATED RINGERS IV SOLN
INTRAVENOUS | Status: DC
Start: 2015-09-18 — End: 2015-09-18

## 2015-09-18 MED ORDER — DEXAMETHASONE SODIUM PHOSPHATE 4 MG/ML IJ SOLN
INTRAMUSCULAR | Status: AC
  Filled 2015-09-18: qty 1

## 2015-09-18 MED ORDER — ZOLPIDEM TARTRATE 5 MG PO TABS: 5.0000 mg | ORAL_TABLET | Freq: Every evening | ORAL | Status: DC | PRN

## 2015-09-18 SURGICAL SUPPLY — 29 items
CLAMP CORD UMBIL (MISCELLANEOUS) IMPLANT
CLOTH BEACON ORANGE TIMEOUT ST (SAFETY) ×3 IMPLANT
DRSG OPSITE POSTOP 4X10 (GAUZE/BANDAGES/DRESSINGS) ×3 IMPLANT
DURAPREP 26ML APPLICATOR (WOUND CARE) ×3 IMPLANT
ELECT REM PT RETURN 9FT ADLT (ELECTROSURGICAL) ×3
ELECTRODE REM PT RTRN 9FT ADLT (ELECTROSURGICAL) ×1 IMPLANT
EXTRACTOR VACUUM M CUP 4 TUBE (SUCTIONS) IMPLANT
EXTRACTOR VACUUM M CUP 4' TUBE (SUCTIONS)
GLOVE BIOGEL PI IND STRL 7.0 (GLOVE) ×3 IMPLANT
GLOVE BIOGEL PI INDICATOR 7.0 (GLOVE) ×6
GLOVE ECLIPSE 7.0 STRL STRAW (GLOVE) ×3 IMPLANT
GOWN STRL REUS W/TWL LRG LVL3 (GOWN DISPOSABLE) ×6 IMPLANT
KIT ABG SYR 3ML LUER SLIP (SYRINGE) IMPLANT
NEEDLE HYPO 22GX1.5 SAFETY (NEEDLE) ×3 IMPLANT
NEEDLE HYPO 25X5/8 SAFETYGLIDE (NEEDLE) ×3 IMPLANT
NS IRRIG 1000ML POUR BTL (IV SOLUTION) ×3 IMPLANT
PACK C SECTION WH (CUSTOM PROCEDURE TRAY) ×3 IMPLANT
PAD ABD 7.5X8 STRL (GAUZE/BANDAGES/DRESSINGS) ×3 IMPLANT
PAD OB MATERNITY 4.3X12.25 (PERSONAL CARE ITEMS) ×3 IMPLANT
PENCIL SMOKE EVAC W/HOLSTER (ELECTROSURGICAL) ×3 IMPLANT
RTRCTR C-SECT PINK 25CM LRG (MISCELLANEOUS) IMPLANT
SUT PDS AB 0 CTX 36 PDP370T (SUTURE) ×3 IMPLANT
SUT PLAIN 2 0 XLH (SUTURE) IMPLANT
SUT VIC AB 0 CTX 36 (SUTURE) ×6
SUT VIC AB 0 CTX36XBRD ANBCTRL (SUTURE) ×3 IMPLANT
SUT VIC AB 4-0 KS 27 (SUTURE) ×3 IMPLANT
SYR CONTROL 10ML LL (SYRINGE) ×3 IMPLANT
TOWEL OR 17X24 6PK STRL BLUE (TOWEL DISPOSABLE) ×3 IMPLANT
TRAY FOLEY CATH SILVER 14FR (SET/KITS/TRAYS/PACK) ×3 IMPLANT

## 2015-09-18 NOTE — Lactation Note (Signed)
This note was copied from a baby's chart. Lactation Consultation Note  Patient Name: Deborah Veda CanningCarole Ryan-Martinez ZOXWR'UToday's Date: 09/18/2015 Reason for consult: Initial assessment  Baby 3 hours old. Called to assist with latching baby and hand expression d/t baby's low glucose. Assisted to latch baby to right breast and baby latched deeply, maintained a deep latch and suckled rhythmically with a few swallows noted. Baby nursed for 15 minutes. Mom had large emesis, so patient's bedside RN, Jan, called. After mom comfortable, hand expressed both breasts and finger-fed baby drops of colostrum. Baby quiet alert throughout the feeding and tolerated well. Enc FOB to hold baby upright on should while mom rested. CN RN, Sharl MaMarty, aware that baby finger-fed colostrum. Enc parents to put baby to breast with cues. Mom will need more education, but too sleepy at this time.  Maternal Data Has patient been taught Hand Expression?: Yes Does the patient have breastfeeding experience prior to this delivery?: No  Feeding Feeding Type: Breast Fed  LATCH Score/Interventions Latch: Grasps breast easily, tongue down, lips flanged, rhythmical sucking. Intervention(s): Adjust position;Assist with latch;Breast compression  Audible Swallowing: A few with stimulation Intervention(s): Skin to skin;Hand expression  Type of Nipple: Everted at rest and after stimulation  Comfort (Breast/Nipple): Soft / non-tender     Hold (Positioning): Assistance needed to correctly position infant at breast and maintain latch. Intervention(s): Breastfeeding basics reviewed;Support Pillows;Position options;Skin to skin  LATCH Score: 8  Lactation Tools Discussed/Used     Consult Status Consult Status: Follow-up Date: 09/19/15 Follow-up type: In-patient    Geralynn OchsWILLIARD, Ebonie Westerlund 09/18/2015, 11:20 PM

## 2015-09-18 NOTE — MAU Note (Signed)
Pt states she started having contractions about 45 minutes ago and they are about 4 minutes apart.  Pt states she is having pink tinged discharge that started about 0745.

## 2015-09-18 NOTE — Transfer of Care (Signed)
Immediate Anesthesia Transfer of Care Note  Patient: Deborah Alexander  Procedure(s) Performed: Procedure(s): CESAREAN SECTION (N/A)  Patient Location: PACU  Anesthesia Type:Epidural  Level of Consciousness: awake, alert  and oriented  Airway & Oxygen Therapy: Patient Spontanous Breathing  Post-op Assessment: Report given to RN and Post -op Vital signs reviewed and stable  Post vital signs: Reviewed and stable  Last Vitals:  Filed Vitals:   09/18/15 1830 09/18/15 1900  BP: 112/90 111/94  Pulse: 82 80  Temp:    Resp: 16     Last Pain:  Filed Vitals:   09/18/15 1933  PainSc: 5          Complications: No apparent anesthesia complications

## 2015-09-18 NOTE — Progress Notes (Signed)
Deborah CanningCarole Alexander is a 40 y.o. G2P0010 at 6746w2d admitted for PROM in the setting of A2GDM.  Subjective: Comfortable with epidural  Objective: BP 112/90 mmHg  Pulse 82  Temp(Src) 98.9 F (37.2 C) (Oral)  Resp 16  Ht 5\' 3"  (1.6 m)  Wt 199 lb (90.266 kg)  BMI 35.26 kg/m2  SpO2 99%  LMP 12/24/2014  FHT:  FHR: 160 bpm, variability: minimal ,  accelerations:  Abscent,  decelerations:  Present variable and late UC:   regular, every 3-4 minutes SVE:   Dilation: 4.5 Effacement (%): 80 Station: -3 Significant caput and molding noted Exam by:: Dr. Macon LargeAnyanwu  Labs: Lab Results  Component Value Date   WBC 5.7 09/18/2015   HGB 13.4 09/18/2015   HCT 38.0 09/18/2015   MCV 83.5 09/18/2015   PLT 246 09/18/2015    Assessment / Plan: Persistent nonreassuring fetal heart rate tracing (Category II), remote from vaginal delivery.  Cesarean delivery recommended.  The risks of cesarean section discussed with the patient included but were not limited to: bleeding which may require transfusion or reoperation; infection which may require antibiotics; injury to bowel, bladder, ureters or other surrounding organs; injury to the fetus; need for additional procedures including hysterectomy in the event of a life-threatening hemorrhage; placental abnormalities wth subsequent pregnancies, incisional problems, thromboembolic phenomenon and other postoperative/anesthesia complications. The patient concurred with the proposed plan, giving informed written consent for the procedure.  Anesthesia and OR aware. Preoperative prophylactic antibiotics and SCDs ordered on call to the OR.  To OR when ready.   Tereso NewcomerANYANWU,Margarie Mcguirt A, MD 09/18/2015, 7:06 PM

## 2015-09-18 NOTE — Progress Notes (Signed)
Dr. Alvester MorinNewton notified that pt is in MAU.  Notified that pt is a G2P0 at 4833w2d.  Notified that pt is scheduled for induction on 09-23-15 at 0730 for gestational diabetes.  Notified that pt is having contractions and complains of pink tinged watery discharge.  Notified that strip is not yet reactive but pt was just given apple juice.  Provider states to collect an amnisure and check the pt.  Provider notified that pt will be turned on her side after her cervical exam.

## 2015-09-18 NOTE — Anesthesia Procedure Notes (Signed)
Epidural Patient location during procedure: OB Start time: 09/18/2015 11:43 AM End time: 09/18/2015 11:50 AM  Staffing Anesthesiologist: Shona SimpsonHOLLIS, Clydene Burack D Performed by: anesthesiologist   Preanesthetic Checklist Completed: patient identified, site marked, surgical consent, pre-op evaluation, timeout performed, IV checked, risks and benefits discussed and monitors and equipment checked  Epidural Patient position: sitting Prep: ChloraPrep Patient monitoring: heart rate, continuous pulse ox and blood pressure Approach: midline Location: L3-L4 Injection technique: LOR saline  Needle:  Needle type: Tuohy  Needle gauge: 17 G Needle length: 9 cm Catheter type: closed end flexible Catheter size: 20 Guage Test dose: negative and 1.5% lidocaine  Assessment Events: blood not aspirated, injection not painful, no injection resistance and no paresthesia  Additional Notes LOR @ 5.5  Patient identified. Risks/Benefits/Options discussed with patient including but not limited to bleeding, infection, nerve damage, paralysis, failed block, incomplete pain control, headache, blood pressure changes, nausea, vomiting, reactions to medications, itching and postpartum back pain. Confirmed with bedside nurse the patient's most recent platelet count. Confirmed with patient that they are not currently taking any anticoagulation, have any bleeding history or any family history of bleeding disorders. Patient expressed understanding and wished to proceed. All questions were answered. Sterile technique was used throughout the entire procedure. Please see nursing notes for vital signs. Test dose was given through epidural catheter and negative prior to continuing to dose epidural or start infusion. Warning signs of high block given to the patient including shortness of breath, tingling/numbness in hands, complete motor block, or any concerning symptoms with instructions to call for help. Patient was given instructions  on fall risk and not to get out of bed. All questions and concerns addressed with instructions to call with any issues or inadequate analgesia.    Reason for block:procedure for pain

## 2015-09-18 NOTE — Anesthesia Postprocedure Evaluation (Signed)
Anesthesia Post Note  Patient: Deborah Alexander  Procedure(s) Performed: Procedure(s) (LRB): CESAREAN SECTION (N/A)  Patient location during evaluation: PACU Anesthesia Type: Epidural Level of consciousness: oriented and awake and alert Pain management: pain level controlled Vital Signs Assessment: post-procedure vital signs reviewed and stable Respiratory status: spontaneous breathing, respiratory function stable and patient connected to nasal cannula oxygen Cardiovascular status: blood pressure returned to baseline and stable Postop Assessment: no headache, no backache and epidural receding Anesthetic complications: no     Last Vitals:  Filed Vitals:   09/18/15 2030 09/18/15 2045  BP: 112/74 112/64  Pulse: 99 98  Temp:  37.3 C  Resp: 23 17    Last Pain:  Filed Vitals:   09/18/15 2050  PainSc: 0-No pain   Pain Goal:                 Shelton SilvasKevin D Kieth Hartis

## 2015-09-18 NOTE — Progress Notes (Signed)
Dr. Alvester MorinNewton notified that Amnisure was positive.  Also notified that pt has been having elevated blood pressures so pt is on cycling blood pressure.  Provider states to admit pt to labor and delivery.

## 2015-09-18 NOTE — Anesthesia Preprocedure Evaluation (Signed)
Anesthesia Evaluation  Patient identified by MRN, date of birth, ID band Patient awake    Reviewed: Allergy & Precautions, NPO status , Patient's Chart, lab work & pertinent test results  Airway Mallampati: III  TM Distance: >3 FB Neck ROM: Full    Dental  (+) Teeth Intact   Pulmonary former smoker,    breath sounds clear to auscultation- rhonchi       Cardiovascular hypertension,  Rhythm:Regular Rate:Normal     Neuro/Psych negative neurological ROS  negative psych ROS   GI/Hepatic Neg liver ROS, GERD  Medicated,  Endo/Other  diabetes, Gestational, Oral Hypoglycemic Agents  Renal/GU negative Renal ROS  negative genitourinary   Musculoskeletal negative musculoskeletal ROS (+)   Abdominal   Peds negative pediatric ROS (+)  Hematology negative hematology ROS (+)   Anesthesia Other Findings   Reproductive/Obstetrics (+) Pregnancy                             Lab Results  Component Value Date   WBC 5.7 09/18/2015   HGB 13.4 09/18/2015   HCT 38.0 09/18/2015   MCV 83.5 09/18/2015   PLT 246 09/18/2015   No results found for: INR, PROTIME   Anesthesia Physical Anesthesia Plan  ASA: III  Anesthesia Plan: Epidural   Post-op Pain Management:    Induction:   Airway Management Planned:   Additional Equipment:   Intra-op Plan:   Post-operative Plan:   Informed Consent: I have reviewed the patients History and Physical, chart, labs and discussed the procedure including the risks, benefits and alternatives for the proposed anesthesia with the patient or authorized representative who has indicated his/her understanding and acceptance.     Plan Discussed with:   Anesthesia Plan Comments:         Anesthesia Quick Evaluation

## 2015-09-18 NOTE — Op Note (Signed)
Deborah Alexander PROCEDURE DATE: 09/18/2015  PREOPERATIVE DIAGNOSES: Intrauterine pregnancy at 6666w2d weeks gestation; non-reassuring fetal status; A2GDM  POSTOPERATIVE DIAGNOSES: The same  PROCEDURE: Primary Low Transverse Cesarean Section  SURGEON:  Dr. Jaynie CollinsUgonna Allana Shrestha  ANESTHESIOLOGIST: Dr. Shona SimpsonKevin Hollis  INDICATIONS: Deborah Alexander is a 40 y.o. G2P1011 at 5266w2d here for cesarean section secondary to the indications listed under preoperative diagnoses; please see preoperative note for further details.  The risks of cesarean section were discussed with the patient including but were not limited to: bleeding which may require transfusion or reoperation; infection which may require antibiotics; injury to bowel, bladder, ureters or other surrounding organs; injury to the fetus; need for additional procedures including hysterectomy in the event of a life-threatening hemorrhage; placental abnormalities wth subsequent pregnancies, incisional problems, thromboembolic phenomenon and other postoperative/anesthesia complications.   The patient concurred with the proposed plan, giving informed written consent for the procedure.    FINDINGS:  Viable female infant in cephalic presentation.  Apgars 8 and 9, arterial cord pH 7.13.  Clear amniotic fluid.  Intact placenta, three vessel cord.  Normal uterus, fallopian tubes and ovaries bilaterally.  ANESTHESIA: Epidural INTRAVENOUS FLUIDS: 1500 ml ESTIMATED BLOOD LOSS: 700 ml URINE OUTPUT:  75 ml SPECIMENS: Placenta sent to pathology COMPLICATIONS: None immediate  PROCEDURE IN DETAIL:  The patient preoperatively received intravenous antibiotics and had sequential compression devices applied to her lower extremities.  She was then taken to the operating room where the epidural anesthesia was dosed up to surgical level and was found to be adequate. She was then placed in a dorsal supine position with a leftward tilt, and prepped and draped in a sterile  manner.  A foley catheter was placed into her bladder and attached to constant gravity.  After an adequate timeout was performed, a Pfannenstiel skin incision was made with scalpel and carried through to the underlying layer of fascia. The fascia was incised in the midline, and this incision was extended bilaterally using the Mayo scissors.  Kocher clamps were applied to the superior aspect of the fascial incision and the underlying rectus muscles were dissected off bluntly.  The rectus muscles were separated in the midline bluntly and the peritoneum was entered bluntly. Attention was turned to the lower uterine segment where a low transverse hysterotomy was made with a scalpel and extended bilaterally bluntly.  The infant was successfully delivered, the cord was clamped and cut, and the infant was handed over to the awaiting neonatology team. Uterine massage was then administered, and the placenta delivered intact with a three-vessel cord. The uterus was then cleared of clot and debris.  The hysterotomy was closed with 0 Vicryl in a running locked fashion, and an imbricating layer was also placed with 0 Vicryl.  The pelvis was cleared of all clot and debris. Hemostasis was confirmed on all surfaces.  The peritoneum and the muscles were reapproximated using 0 Vicryl interrupted stitches. The fascia was then closed using 0 Vicryl in a running fashion.  The subcutaneous layer was irrigated, then reapproximated with two 0 Vicryl interrupted stitches, and 30 ml of 0.5% Marcaine was injected subcutaneously around the incision.  The skin was closed with a 4-0 Vicryl subcuticular stitch. The patient tolerated the procedure well. Sponge, lap, instrument and needle counts were correct x 3.  She was taken to the recovery room in stable condition.    Deborah CollinsUGONNA  Manon Banbury, MD, FACOG Attending Obstetrician & Gynecologist Faculty Practice, [facility] [place]

## 2015-09-18 NOTE — H&P (Signed)
OBSTETRIC ADMISSION HISTORY AND PHYSICAL  Deborah Alexander is a 40 y.o. female G2P0010 with IUP at [redacted]w[redacted]d by 9wk [place] complicated by A2GDM who presents with PROM. Pt. Reports waking up around 0700 this morning with sharp pains in her lower abdomen and when she went to use the bathroom noticed a "gush" of fluid. She is still having some trickling of fluid down her legs and has noticed some bloody show after using the restroom. She reports +FMs and denies any LOF, VB, blurry vision, headaches, peripheral edema, or vomiting.  She plans on Breast feeding. She request BTL for birth control.  Dating: By 9wk [place] --->  Estimated Date of Delivery: 09/30/15  Sono:   , CWD, normal anatomy, cephalic presentation, 2489g, 16% EFW   Prenatal History/Complications:  Past Medical History: Past Medical History  Diagnosis Date  . GDM (gestational diabetes mellitus)     Past Surgical History: Past Surgical History  Procedure Laterality Date  . Breast surgery  1997-1998    Obstetrical History: OB History    Gravida Para Term Preterm AB TAB SAB Ectopic Multiple Living   0         Social History: Social History   Social History  . Marital Status: Married    Spouse Name: N/A  . Number of Children: N/A  . Years of Education: N/A   Social History Main Topics  . Smoking status: Former Games developer  . Smokeless tobacco: Never Used  . Alcohol Use: 0.0 oz/week    0 Standard drinks or equivalent per week     Comment: weekends  . Drug Use: No  . Sexual Activity:    Partners: Male    Birth Control/ Protection: None   Other Topics Concern  . None   Social History Narrative    Family History: Family History  Problem Relation Age of Onset  . Hypertension Mother   . Heart disease Mother   . Hypertension Father   . Diabetes Father   . Heart disease Father     Allergies: No Known Allergies  Prescriptions prior to admission  Medication Sig Dispense Refill Last Dose  . glucose  blood test strip Use as instructed 100 each 12 09/17/2015 at Unknown time  . glyBURIDE (DIABETA) 2.5 MG tablet TAKE 1/2TAB TWICE DAILY WITH MEALS,CAN INCREASE TO 1TAB TWICE DAILY FOR BETTER GLYCEMIC CONTROL 60 tablet 3 09/17/2015 at Unknown time  . Lancets (ONETOUCH ULTRASOFT) lancets Use as instructed 100 each 12 09/17/2015 at Unknown time  . Prenatal Vit-Fe Fumarate-FA (MULTIVITAMIN-PRENATAL) 27-0.8 MG TABS tablet Take 1 tablet by mouth daily at 12 noon.   09/17/2015 at Unknown time  . ranitidine (ZANTAC) 150 MG tablet Take 150 mg by mouth 2 (two) times daily.   09/17/2015 at Unknown time  . aspirin EC 81 MG tablet Take 1 tablet (81 mg total) by mouth daily. Take after 12 weeks for prevention of preeclampsia later in pregnancy (Patient not taking: Reported on 09/18/2015) 300 tablet 2 Taking  . glyBURIDE (DIABETA) 2.5 MG tablet TAKE 1/2TAB TWICE DAILY WITH MEALS,CAN INCREASE TO 1TAB TWICE DAILY FOR BETTER GLYCEMIC CONTROL (Patient not taking: Reported on 09/18/2015) 60 tablet 3 Taking  . Misc. Devices (BREAST PUMP) MISC Use as needed (Patient not taking: Reported on 09/18/2015) 1 each 0 Taking  . Misc. Devices (BREAST PUMP) MISC 1 Device by Does not apply route as directed. Electric Breast pump.  Dx z391 (Patient not taking: Reported on 09/18/2015) 1 each 0 Taking  Review of Systems   GENERAL:  No fever, chills, weakness HEENT:  No vision changes, congestion, or throat pain. SKIN:  No rash or bruising CARDIOVASCULAR:  No chest pain, palpitations or edema. RESPIRATORY:  No shortness of breath, no cough GASTROINTESTINAL:  No nausea, vomiting or diarrhea. No abdominal pain. GENITOURINARY:  IUP Pregnancy. Last menstrual period, 12/24/14, no vaginal bleeding NEUROLOGICAL:  No headache, dizziness, or numbness MUSCULOSKELETAL:  No back pain. ENDOCRINOLOGIC:  No reports of sweating, cold or heat intolerance.  Blood pressure 101/63, pulse 103, temperature 98.3 F (36.8 C), temperature source Oral, resp.  rate 16, height 5\' 3"  (1.6 m), weight 90.266 kg (199 lb), last menstrual period 12/24/2014, SpO2 99 %. General appearance: alert, cooperative and appears stated age Lungs: clear to auscultation bilaterally Heart: regular rate and rhythm, no murmurs Abdomen: soft, non-tender; bowel sounds normal Pelvic: See below Extremities: Homans sign is negative, trace edema, no ecchymosis DTR's: Equal bilaterally Presentation: cephalic Fetal monitoring: baseline 150-160/moderate variability/10x10 accels/prolonged decels Cervical Exam      Dilation  5 filed at 09/18/2015 1415    Effacement (%)  90 filed at 09/18/2015 1415    Cervical Position  Posterior filed at 09/18/2015 1004    Cervical Consistency  Medium filed at 09/18/2015 1004    Vag. Bleeding  Bloody Show filed at 09/18/2015 1004    Station  -2 filed at 09/18/2015 1235    Presentation  Vertex filed at 09/18/2015 1415    Exam by:  Dr.Ladrea Holladay filed at 09/18/2015 1415      Prenatal labs: ABO, Rh: --/--/O POS (05/20 1055) Antibody: NEG (05/20 1055) Rubella: Immune RPR: NON REAC (03/10 0939)  HBsAg: NEGATIVE (10/28 1030)  HIV: NONREACTIVE (03/10 0939)  GBS: Positive (05/05 0000)  1 hr Glucola: 207 Genetic screening: Normal NT/NB. AFP neg. NIPS normal. Anatomy US: Normal, except anterior placenta previa-resolved on later scans  Prenatal Transfer Tool  Maternal Diabetes: Yes:  Diabetes Type:  Insulin/Medication controlled Genetic Screening: Normal Maternal Ultrasounds/Referrals: Normal Fetal Ultrasounds or other Referrals:  None Maternal Substance Abuse:  No Significant Maternal Medications:  Glyburide 2.5mg  BID Significant Maternal Lab Results: Lab values include: Group B Strep positive  Results for orders placed or performed during the hospital encounter of 09/18/15 (from the past 24 hour(s))  Amnisure rupture of membrane (rom)not at [facility]   Collection Time: 09/18/15  9:27 AM  Result Value Ref Range   Amnisure ROM  POSITIVE   CBC   Collection Time: 09/18/15 10:55 AM  Result Value Ref Range   WBC 5.7 4.0 - 10.5 K/uL   RBC 4.55 3.87 - 5.11 MIL/uL   Hemoglobin 13.4 12.0 - 15.0 g/dL   HCT 04.538.0 40.936.0 - 81.146.0 %   MCV 83.5 78.0 - 100.0 fL   MCH 29.5 26.0 - 34.0 pg   MCHC 35.3 30.0 - 36.0 g/dL   RDW 91.413.8 78.211.5 - 95.615.5 %   Platelets 246 150 - 400 K/uL  Type and screen [facility]   Collection Time: 09/18/15 10:55 AM  Result Value Ref Range   ABO/RH(D) O POS    Antibody Screen NEG    Sample Expiration 09/21/2015     Patient Active Problem List   Diagnosis Date Noted  . Active labor 09/18/2015  . Group B Streptococcus carrier, +RV culture, currently pregnant 09/08/2015  . Placenta previa antepartum 05/06/2015  . Preexisting diabetes complicating pregnancy, antepartum 03/01/2015  . Supervision of high-risk pregnancy 02/26/2015  . Advanced maternal age, antepartum 02/26/2015  Assessment: Deborah Alexander is a 40 y.o. G2P0010 at [redacted]w[redacted]d here for PROM in stable condition.  #Labor: Progressing well, will augment with Pitocin #Pain: Epidural in place #FWB:category 1 strip. EFW 71st% #ID: GBS: pos #MOF: Breast Feeding #MOC: BTL vs. Nexplanon #Circ: yes  #T2DM: CBG q2 hrs in active labor. Continue glyburide postpartum and consider metformin.  Barnie Mort, Med Student  09/18/2015, 2:02 PM  OB fellow attestation: I have seen and examined this patient; I agree with above documentation in the medical student's note.   Deborah Alexander is a 40 y.o. G2P0010 here for PROM   PE: BP 112/90 mmHg  Pulse 82  Temp(Src) 98.9 F (37.2 C) (Oral)  Resp 16  Ht 5\' 3"  (1.6 m)  Wt 199 lb (90.266 kg)  BMI 35.26 kg/m2  SpO2 99%  LMP 12/24/2014 Gen: calm comfortable, NAD Resp: normal effort, no distress Abd: gravid  ROS, labs, PMH reviewed  Plan: Admit to LD Labor: Augment with pitocin given advanced dilation FWB: Cat I ID: [id number]  POS- PCN ordered  Federico Flake,  MD  Family Medicine, OB Fellow 09/18/2015, 6:44 PM

## 2015-09-19 LAB — CBC
HEMATOCRIT: 34.7 % — AB (ref 36.0–46.0)
HEMOGLOBIN: 11.9 g/dL — AB (ref 12.0–15.0)
MCH: 28.8 pg (ref 26.0–34.0)
MCHC: 34.3 g/dL (ref 30.0–36.0)
MCV: 84 fL (ref 78.0–100.0)
Platelets: 222 10*3/uL (ref 150–400)
RBC: 4.13 MIL/uL (ref 3.87–5.11)
RDW: 13.9 % (ref 11.5–15.5)
WBC: 14 10*3/uL — AB (ref 4.0–10.5)

## 2015-09-19 LAB — RPR: RPR: NONREACTIVE

## 2015-09-19 LAB — GLUCOSE, CAPILLARY: Glucose-Capillary: 117 mg/dL — ABNORMAL HIGH (ref 65–99)

## 2015-09-19 NOTE — Progress Notes (Signed)
Mom's a.m. Blood sugar was 117.  Mom had not eaten since before delivery at 1928; vomited X2 in first 6 hours post-delivery. Since dose of zofran at 0200, patient has not been nauseated.  Crackers and apple juice given at 0500, which she kept down.

## 2015-09-19 NOTE — Addendum Note (Signed)
Addendum  created 09/19/15 0749 by Angela Adamana G Shadoe Cryan, CRNA   Modules edited: Clinical Notes   Clinical Notes:  File: [id number]

## 2015-09-19 NOTE — Progress Notes (Signed)
POD 1 Subjective:  Deborah Alexander is a 40 y.o. G2P1011 5423w2d s/p pLTCS for fetal indication and T2DM.  No acute events overnight.  Pt denies problems with ambulating, voiding or po intake.  She denies nausea or vomiting.  Pain is well controlled.  She has not had flatus.  Lochia Minimal.  Plan for birth control is interval bilateral tubal ligation. Method of Feeding:  Breast.  Objective: Blood pressure 117/70, pulse 85, temperature 97.8 F (36.6 C), temperature source Oral, resp. rate 18, height 5\' 3"  (1.6 m), weight 199 lb (90.266 kg), last menstrual period 12/24/2014, SpO2 98 %, unknown if currently breastfeeding.  Physical Exam:  General: alert, cooperative and no distress Lochia:normal flow Chest: normal WOB Heart: Regular rate Abdomen: +BS, soft, mild TTP (appropriate). Incision: dressing in place, no blood or fluid leak Uterine Fundus: firm DVT Evaluation: No evidence of DVT seen on physical exam. SCD in place Extremities: no edema   Recent Labs  09/18/15 1055 09/19/15 0547  HGB 13.4 11.9*  HCT 38.0 34.7*    Assessment/Plan:  ASSESSMENT: Deborah Alexander is a 40 y.o. G2P1011 7623w2d s/p pLTCS. FBG 117 this morning. -Will repeat FBG in the morning-ordered Plan for discharge tomorrow Continue routine PP care Breastfeeding support PRN  LOS: 1 day   Almon Herculesaye T Gonfa 09/19/2015, 8:24 AM

## 2015-09-19 NOTE — Anesthesia Postprocedure Evaluation (Signed)
Anesthesia Post Note  Patient: Deborah Alexander  Procedure(s) Performed: Procedure(s) (LRB): CESAREAN SECTION (N/A)  Patient location during evaluation: Mother Baby Anesthesia Type: Epidural Level of consciousness: awake and alert, oriented and patient cooperative Pain management: pain level controlled Vital Signs Assessment: post-procedure vital signs reviewed and stable Respiratory status: spontaneous breathing Cardiovascular status: stable Postop Assessment: no headache, epidural receding, patient able to bend at knees and no signs of nausea or vomiting Anesthetic complications: no Comments: Pt denies pain.     Last Vitals:  Filed Vitals:   09/19/15 0100 09/19/15 0500  BP:  117/70  Pulse: 92 85  Temp: 36.7 C 36.6 C  Resp: 18 18    Last Pain:  Filed Vitals:   09/19/15 0527  PainSc: 4    Pain Goal:                 [facility] Center[facility]LE,[name]

## 2015-09-20 ENCOUNTER — Encounter (HOSPITAL_COMMUNITY): Payer: Self-pay | Admitting: Obstetrics & Gynecology

## 2015-09-20 LAB — GLUCOSE, CAPILLARY: Glucose-Capillary: 90 mg/dL (ref 65–99)

## 2015-09-20 MED ORDER — OXYCODONE-ACETAMINOPHEN 5-325 MG PO TABS: 1.0000 | ORAL_TABLET | ORAL | Status: DC | PRN

## 2015-09-20 NOTE — Discharge Summary (Signed)
OB Discharge Summary  Patient Name: Deborah Alexander DOB: Aug 23, 1975 MRN: [id number]  Date of admission: 09/18/2015 Delivering MD: Jaynie Collins A   Date of discharge: 09/20/2015  Admitting diagnosis: 39WKS BLEEDING CONTRACTION 4-5 MINS  Intrauterine pregnancy: [redacted]w[redacted]d     Secondary diagnosis:Active Problems:   Active labor  Additional problems:none     Discharge diagnosis: Term Pregnancy Delivered                                                                     Post partum procedures:none  Augmentation: Pitocin  Complications: None  Hospital course:  Onset of Labor With Unplanned C/S  40 y.o. yo G2P1011 at [redacted]w[redacted]d was admitted in Active Labor on 09/18/2015. Patient had a labor course significant for fetal intolerance of labor. Membrane Rupture Time/Date: 7:30 AM ,09/18/2015   The patient went for cesarean section due to Non-Reassuring FHR, and delivered a Viable infant,09/18/2015  Details of operation can be found in separate operative note. Patient had an uncomplicated postpartum course.  She is ambulating,tolerating a regular diet, passing flatus, and urinating well.  Patient is discharged home in stable condition 09/20/2015.  Physical exam  Filed Vitals:   09/19/15 0930 09/19/15 1401 09/19/15 1739 09/20/15 0526  BP: 128/72 107/68 121/68 122/56  Pulse: 90 74 92 83  Temp: 98 F (36.7 C) 97.9 F (36.6 C) 98.4 F (36.9 C) 98 F (36.7 C)  TempSrc: Oral Oral Oral Oral  Resp: Height:      Weight:      SpO2: 99% 100% 100% 99%   General: alert, cooperative and no distress Lochia: appropriate Uterine Fundus: firm Incision: Healing well with no significant drainage, No significant erythema, Dressing is clean, dry, and intact DVT Evaluation: Negative Homan's sign. No cords or calf tenderness. No significant calf/ankle edema. Labs: Lab Results  Component Value Date   WBC 14.0* 09/19/2015   HGB 11.9* 09/19/2015   HCT 34.7* 09/19/2015   MCV 84.0  09/19/2015   PLT 222 09/19/2015   CMP Latest Ref Rng 03/24/2015  Glucose 65 - 99 mg/dL 409(W)  BUN 7 - 25 mg/dL 6(L)  Creatinine 1.19 - 1.10 mg/dL 1.47  Sodium 829 - 562 mmol/L 136  Potassium 3.5 - 5.3 mmol/L 3.7  Chloride 98 - 110 mmol/L 102  CO2 20 - 31 mmol/L 22  Calcium 8.6 - 10.2 mg/dL 8.9  Total Protein 6.1 - 8.1 g/dL 6.6  Total Bilirubin 0.2 - 1.2 mg/dL 0.3  Alkaline Phos 33 - 115 U/L 40  AST 10 - 30 U/L 22  ALT 6 - 29 U/L 35(H)    Discharge instruction: per After Visit Summary and "Baby and Me Booklet".  After Visit Meds:    Medication List    ASK your doctor about these medications        aspirin EC 81 MG tablet  Take 1 tablet (81 mg total) by mouth daily. Take after 12 weeks for prevention of preeclampsia later in pregnancy     Breast Pump Misc  Use as needed     Breast Pump Misc  1 Device by Does not apply route as directed. Electric Breast pump.  Dx z391     glucose blood  test strip  Use as instructed     glyBURIDE 2.5 MG tablet  Commonly known as:  DIABETA  TAKE 1/2TAB TWICE DAILY WITH MEALS,CAN INCREASE TO 1TAB TWICE DAILY FOR BETTER GLYCEMIC CONTROL     glyBURIDE 2.5 MG tablet  Commonly known as:  DIABETA  TAKE 1/2TAB TWICE DAILY WITH MEALS,CAN INCREASE TO 1TAB TWICE DAILY FOR BETTER GLYCEMIC CONTROL     multivitamin-prenatal 27-0.8 MG Tabs tablet  Take 1 tablet by mouth daily at 12 noon.     onetouch ultrasoft lancets  Use as instructed     ranitidine 150 MG tablet  Commonly known as:  ZANTAC  Take 150 mg by mouth 2 (two) times daily.        Diet: routine diet  Activity: Advance as tolerated. Pelvic rest for 6 weeks.   Outpatient follow up:6 weeks Follow up Appt:Future Appointments Date Time Provider Department Center  09/21/2015 3:30 PM [facility] NURSE [facility]   Follow up visit: No Follow-up on file.  Postpartum contraception: Undecided  Newborn Data: Live born female  Birth Weight: 6 lb 8.6 oz (2965 g) APGAR: 8,  9  Baby Feeding: Breast Disposition:home with mother   09/20/2015 Deborah Alexander, CNM

## 2015-09-20 NOTE — Lactation Note (Signed)
This note was copied from a baby's chart. Lactation Consultation Note  Patient Name: Deborah Alexander WUJWJ'XToday's Date: 09/20/2015 Reason for consult: Follow-up assessment  Assessed and assisted Mom latching baby, baby 3544 hrs old.  Baby positioned a little higher at breast level.  Baby able to latch deeply and with wide gape of mouth.  Mom denies feeling any discomfort.  Teaching done on hand placement and need for support of baby's head.  Baby sucking vigorously with regular swallowing heard.  Mom taught to use alternate breast compression during sucking bursts.  Recommended pumping both breasts after breast feeding, to stimulate and support her milk supply.  Mom choosing to supplement after breast feeding.  Recommended she only given 10-20 ml if baby not contented after breast feeding. To follow up in am.  Encouraged Mom to call for assistance with latching as needed.    Maternal Data Has patient been taught Hand Expression?: Yes (LC reviewed with mom  , unable to express from either breast )  Feeding Feeding Type: Breast Fed  LATCH Score/Interventions Latch: Grasps breast easily, tongue down, lips flanged, rhythmical sucking. Intervention(s): Breast compression;Breast massage;Assist with latch;Adjust position  Audible Swallowing: Spontaneous and intermittent Intervention(s): Hand expression;Skin to skin Intervention(s): Skin to skin;Hand expression;Alternate breast massage  Type of Nipple: Everted at rest and after stimulation Intervention(s): Double electric pump  Comfort (Breast/Nipple): Soft / non-tender     Hold (Positioning): Assistance needed to correctly position infant at breast and maintain latch. Intervention(s): Breastfeeding basics reviewed;Support Pillows;Position options;Skin to skin  LATCH Score: 9  Lactation Tools Discussed/Used WIC Program: No Pump Review: Setup, frequency, and cleaning;Milk Storage   Consult Status Consult Status: Follow-up Date:  09/21/15 Follow-up type: In-patient    [name], [name] 09/20/2015, 4:29 PM

## 2015-09-20 NOTE — Lactation Note (Signed)
This note was copied from a baby's chart. Lactation Consultation Note  Patient Name: Deborah PieriniHiram F Alexander XBJYN'WToday's Date: 09/20/2015 Reason for consult: Follow-up assessment;Other (Comment);Breast surgery (Breast Implants bilaterally / see LC note/ mom  encouarged to page with feeding cues )  [facility] - Implants both breast due to tubular breast and the implants were placed behind the muscle. Per mom my doctor said  When I had children I probably wouldn't have problems with breast feeding. Mom reports multiply breast changes during pregnancy ( Change in size , darkened areolas  And increased sensitivity ) Mom  showed the LC the incisions . LC noted the incisions to be very small 8- 12 inches on the lower aspect of the areolas. Both nipples erect  and areolas compressible. Mom reports the baby has been latching , not sure if baby is swallowing , or getting enough. Per mom feels she doesn't have milk yet so she decided  To supplement.  LC reviewed hand expressing and when showing mom was unable to express either breast.  LC felt due to mom's hx of tubular breast supplementing would be indicated, even though breast changes, ( no other hx  Noted for milk supply issues )  LC also felt even though mom has a [employer] DEBP from home  , recommended the DEBP Symphony be set up due to hx for extra stimulation.  Baby fed last at 11:55 am  and supplemented at 12:30 pm   With 21 ml of formula, ( per mom ). LC discussed the above findings with the [facility] RN Phillips HayJessica Reynolds and she will set up the DEBP .  Mom aware to call with feeding cues for lactation for feeding assessment.  Mother informed of post-discharge support and given phone number to the lactation department, including services for phone call assistance; out-patient appointments; and breastfeeding support group. List of other breastfeeding resources in the community given in the handout. Encouraged mother to call for problems or  concerns related to breastfeeding.        8- 12 inch   Maternal Data Has patient been taught Hand Expression?: Yes (LC reviewed with mom  , unable to express from either breast )  Feeding Feeding Type:  (per mom baby last fed at 1155 ) Length of feed: 20 min (per mom )  LATCH Score/Interventions Latch: Grasps breast easily, tongue down, lips flanged, rhythmical sucking. Intervention(s): Adjust position;Assist with latch;Breast massage;Breast compression  Audible Swallowing: A few with stimulation Intervention(s): Skin to skin;Hand expression Intervention(s): Skin to skin;Hand expression  Type of Nipple: Everted at rest and after stimulation Intervention(s): Double electric pump  Comfort (Breast/Nipple): Filling, red/small blisters or bruises, mild/mod discomfort  Problem noted: Mild/Moderate discomfort Interventions (Mild/moderate discomfort): Hand expression;Hand massage;Post-pump  Hold (Positioning): No assistance needed to correctly position infant at breast. Intervention(s): Breastfeeding basics reviewed  LATCH Score: 8  Lactation Tools Discussed/Used WIC Program: No Pump Review: Setup, frequency, and cleaning;Milk Storage Initiated by:: Phillips HayJessica Reynolds RN  Date initiated:: 09/20/15   Consult Status Consult Status: Follow-up Date: 09/20/15 Follow-up type: In-patient    Deborah Alexander, Deborah Alexander 09/20/2015, 2:36 PM

## 2015-09-21 ENCOUNTER — Ambulatory Visit: Payer: Self-pay

## 2015-09-21 ENCOUNTER — Other Ambulatory Visit: Payer: Federal, State, Local not specified - PPO

## 2015-09-21 DIAGNOSIS — Z98891 History of uterine scar from previous surgery: Secondary | ICD-10-CM

## 2015-09-21 NOTE — Lactation Note (Signed)
This note was copied from a baby's chart. Lactation Consultation Note  Patient Name: Deborah Alexander: 09/21/2015 Reason for consult: Follow-up assessment   Follow up with first time parents of 5164 hour old infant. Infant with 2 BF and 9 bottle feeds of formula of 5-35 cc, 1 void and 4 stools in last 24 hours prior to this assmt. Infant weight 6 lb 3.5 oz with 5% weight loss since birth.   Mom reports her breasts so not feel fuller. She reports she has been pumping and only getting gtts of colostrum, reviewed normal progression of milk coming to volume. She reports that she BF infant prior to bottle feeding and infant will latch and feed prior to bottle feeds. Enc mom to continue BF first followed by supplementing infant and mom pumping for 15 minutes and feed all EBM back to infant at next feeding prior to bottle feeding formula.  Mom has an [employer] DEBP at home for use.  Mom reports nipple tenderness is minimal, nipple care reviewed.   Infant with f/u ped appt tomorrow. Reviewed all BF information in TAking Care of Baby and Me Booklet. Engorgement prevention/treatment reviewed. Reviewed I/O. Reviewed LC Brochure, mom has phone # and is aware of OP Services and BF Support Groups. Enc mom to call with questions/concerns.   Maternal Data    Feeding Feeding Type: Bottle Fed - Formula Nipple Type: Slow - flow  LATCH Score/Interventions                      Lactation Tools Discussed/Used WIC Program: No Pump Review: Setup, frequency, and cleaning;Milk Storage   Consult Status Consult Status: Complete Follow-up type: Call as needed    Ed BlalockSharon S Collyn Ribas 09/21/2015, 12:03 PM

## 2015-09-21 NOTE — Discharge Summary (Signed)
OB Discharge Summary     Patient Name: Deborah Alexander DOB: Nov 21, 1975 MRN: [id number]  Date of admission: 09/18/2015 Delivering MD: Jaynie CollinsANYANWU, UGONNA A   Date of discharge: 09/21/2015  Admitting diagnosis: 39WKS BLEEDING CONTRACTION 4-5 MINS  Intrauterine pregnancy: 6382w2d     Secondary diagnosis:  Active Problems:   Supervision of high-risk pregnancy   Advanced maternal age, antepartum   Preexisting diabetes complicating pregnancy, antepartum   Placenta previa antepartum   Group B Streptococcus carrier, +RV culture, currently pregnant   S/P primary low transverse C-section  Additional problems: none     Discharge diagnosis: Term Pregnancy Delivered and Type 2 DM                                                                                                Post partum procedures:none  Augmentation: Pitocin and Cytotec  Complications: None  Hospital course:  Induction of Labor With Cesarean Section  40 y.o. yo G2P1011 at 7682w2d was admitted to the hospital 09/18/2015 for induction of labor. Patient had a labor course significant for fetal heart rate decelerations and intolerance of labor while at 4-5 cm. The patient went for cesarean section due to Non-Reassuring FHR, and delivered a Viable infant. Membrane Rupture Time/Date: )7:30 AM ,09/18/2015   Details of operation can be found in separate operative Note.  Patient had an uncomplicated postpartum course. She is ambulating, tolerating a regular diet, passing flatus, and urinating well.  Patient is discharged home in stable condition on 09/21/2015.                                     Physical exam  Filed Vitals:   09/19/15 1739 09/20/15 0526 09/20/15 1800 09/21/15 0600  BP: 121/68 122/56 125/75 138/88  Pulse: 92 83 88 91  Temp: 98.4 F (36.9 C) 98 F (36.7 C) 98.6 F (37 C) 97.8 F (36.6 C)  TempSrc: Oral Oral Oral Oral  Resp: 20 20 18 18   Height:      Weight:      SpO2: 100% 99%     General: alert, cooperative  and no distress Lochia: appropriate Uterine Fundus: firm Incision: Healing well with no significant drainage, No significant erythema, Dressing is clean, dry, and intact DVT Evaluation: No evidence of DVT seen on physical exam. Negative Homan's sign. Labs: Lab Results  Component Value Date   WBC 14.0* 09/19/2015   HGB 11.9* 09/19/2015   HCT 34.7* 09/19/2015   MCV 84.0 09/19/2015   PLT 222 09/19/2015   CMP Latest Ref Rng 03/24/2015  Glucose 65 - 99 mg/dL 409(W114(H)  BUN 7 - 25 mg/dL 6(L)  Creatinine 1.190.50 - 1.10 mg/dL 1.470.50  Sodium 829135 - 562146 mmol/L 136  Potassium 3.5 - 5.3 mmol/L 3.7  Chloride 98 - 110 mmol/L 102  CO2 20 - 31 mmol/L 22  Calcium 8.6 - 10.2 mg/dL 8.9  Total Protein 6.1 - 8.1 g/dL 6.6  Total Bilirubin 0.2 - 1.2 mg/dL 0.3  Alkaline Phos 33 - 115 U/L 40  AST 10 - 30 U/L 22  ALT 6 - 29 U/L 35(H)    Discharge instruction: per After Visit Summary and "Baby and Me Booklet".  After visit meds:    Medication List    STOP taking these medications        aspirin EC 81 MG tablet     glucose blood test strip     glyBURIDE 2.5 MG tablet  Commonly known as:  DIABETA     onetouch ultrasoft lancets      TAKE these medications        Breast Pump Misc  Use as needed     Breast Pump Misc  1 Device by Does not apply route as directed. Electric Breast pump.  Dx [id number]     multivitamin-prenatal 27-0.8 MG Tabs tablet  Take 1 tablet by mouth daily at 12 noon.     oxyCODONE-acetaminophen 5-325 MG tablet  Commonly known as:  PERCOCET/ROXICET  Take 1 tablet by mouth every 4 (four) hours as needed (pain scale 4-7).     ranitidine 150 MG tablet  Commonly known as:  ZANTAC  Take 150 mg by mouth 2 (two) times daily.        Diet: carb modified diet  Activity: Advance as tolerated. Pelvic rest for 6 weeks.   Outpatient follow up:6 weeks Follow up Appt:Future Appointments Date Time Provider Department Center  10/26/2015 9:45 AM Tereso Newcomer, MD [facility]  [facility]    Postpartum contraception: Interval tubal vs LARC  Newborn Data: Live born female  Birth Weight: 6 lb 8.6 oz (2965 g) APGAR: 8, 9  Baby Feeding: Breast Disposition:home with mother   09/21/2015 Federico Flake, MD

## 2015-09-23 ENCOUNTER — Inpatient Hospital Stay (HOSPITAL_COMMUNITY): Admission: RE | Admit: 2015-09-23 | Payer: Federal, State, Local not specified - PPO | Source: Ambulatory Visit

## 2015-09-24 ENCOUNTER — Encounter: Payer: Federal, State, Local not specified - PPO | Admitting: Obstetrics & Gynecology

## 2015-09-28 ENCOUNTER — Other Ambulatory Visit: Payer: Federal, State, Local not specified - PPO

## 2015-10-26 ENCOUNTER — Encounter: Payer: Self-pay | Admitting: Obstetrics & Gynecology

## 2015-10-26 ENCOUNTER — Ambulatory Visit (INDEPENDENT_AMBULATORY_CARE_PROVIDER_SITE_OTHER): Payer: Federal, State, Local not specified - PPO | Admitting: Obstetrics & Gynecology

## 2015-10-26 DIAGNOSIS — O24429 Gestational diabetes mellitus in childbirth, unspecified control: Secondary | ICD-10-CM | POA: Insufficient documentation

## 2015-10-26 DIAGNOSIS — E119 Type 2 diabetes mellitus without complications: Secondary | ICD-10-CM

## 2015-10-26 NOTE — Progress Notes (Signed)
  Subjective:     Deborah Alexander is a 40 y.o. 172P1011 female who presents for a postpartum visit. She is 6 weeks postpartum following a LTCS for NRFHT in the setting ob Class B GDM. I have fully reviewed the prenatal and intrapartum course; DM was diagnosed at 9 weeks with a 1 hr GTT of 207 and was treated as preexisting DM. The delivery was at 38.2 gestational weeks. Postpartum course has been unremarkable. Baby's course has been unremarkable. Baby is feeding by both breast and bottle. Bleeding no bleeding. Bowel function is normal. Bladder function is normal. Patient is not sexually active. Contraception method is none. Postpartum depression screening: negative.  The following portions of the patient's history were reviewed and updated as appropriate: allergies, current medications, past family history, past medical history, past social history, past surgical history and problem list. Normal pap 03/02/2014.  Review of Systems Pertinent items noted in HPI and remainder of comprehensive ROS otherwise negative.   Objective:    BP 105/71 mmHg  Pulse 80  Resp 18  Ht 5\' 3"  (1.6 m)  Wt 184 lb (83.462 kg)  BMI 32.60 kg/m2  Breastfeeding? Yes  General:  alert and no distress   Breasts:  inspection negative, no nipple discharge or bleeding, no masses or nodularity palpable  Lungs: clear to auscultation bilaterally  Heart:  regular rate and rhythm  Abdomen: soft, non-tender; bowel sounds normal; no masses,  no organomegaly   Pelvic:  not evaluated        Assessment:   Normal postpartum exam. Pap smear not done at today's visit.   Plan:   1. Contraception: condoms and rhythm method. Was counseled about other options. 2. Given her DM, she was told to follow up with PCP. 3. Follow up as needed.     Jaynie CollinsUGONNA  ANYANWU, MD, FACOG Attending Obstetrician & Gynecologist, [facility]

## 2015-10-26 NOTE — Patient Instructions (Signed)
Return to clinic for any scheduled appointments or for any gynecologic concerns as needed.   

## 2015-11-25 ENCOUNTER — Ambulatory Visit (INDEPENDENT_AMBULATORY_CARE_PROVIDER_SITE_OTHER): Payer: Federal, State, Local not specified - PPO | Admitting: Family Medicine

## 2015-11-25 ENCOUNTER — Encounter: Payer: Self-pay | Admitting: Family Medicine

## 2015-11-25 VITALS — BP 116/62 | HR 90 | Temp 97.9°F | Resp 14 | Wt 192.0 lb

## 2015-11-25 DIAGNOSIS — E669 Obesity, unspecified: Secondary | ICD-10-CM

## 2015-11-25 DIAGNOSIS — O24429 Gestational diabetes mellitus in childbirth, unspecified control: Secondary | ICD-10-CM

## 2015-11-25 DIAGNOSIS — R635 Abnormal weight gain: Secondary | ICD-10-CM | POA: Diagnosis not present

## 2015-11-25 DIAGNOSIS — D649 Anemia, unspecified: Secondary | ICD-10-CM

## 2015-11-25 NOTE — Assessment & Plan Note (Signed)
Check glucose and A1c today (last thing to eat or drink was two pieces of toast this morning, so return fasting for labs)

## 2015-11-25 NOTE — Assessment & Plan Note (Signed)
Check lipids today 

## 2015-11-25 NOTE — Progress Notes (Signed)
BP 116/62   Pulse 90   Temp 97.9 F (36.6 C) (Oral)   Resp 14   Wt 192 lb (87.1 kg)   LMP 11/18/2015   SpO2 97%   BMI 34.01 kg/m    Subjective:    Patient ID: Deborah Alexander, female    DOB: Nov 17, 1975, 40 y.o.   MRN: [id number]  HPI: Deborah Alexander is a 40 y.o. female  Chief Complaint  Patient presents with  . Blood Sugar Problem   Patient is new to me today; she was told she had diabetes during her pregnancy and they wanted her to get checked out after her delivery to see if she still had it Gestational diabetes, discovered around [redacted] weeks gestation; were controlled; no other complications with pregnancy, C-section, did not dilate Not checking sugars; no dry vision, no blurred vision, no nocturia Obesity; tries to walk; has gained weight after delivery; mother has thyroid trouble Mother had diabetes when patient was pregnant; patient was 6 pounds when she was born  Depression screen [facility] 2/9 11/25/2015 08/20/2015 07/09/2015 04/30/2015 03/26/2015  Decreased Interest 0 0 0 0 0  Down, Depressed, Hopeless 0 0 0 0 0  PHQ - 2 Score 0 0 0 0 0   Relevant past medical, surgical, family and social history reviewed Past Medical History:  Diagnosis Date  . DM w/o complication type II (HCC)    Diagnosed with 1 hr GTT of 207 at [redacted] weeks GA   Past Surgical History:  Procedure Laterality Date  . BREAST SURGERY  1997-1998  . CESAREAN SECTION N/A 09/18/2015   Procedure: CESAREAN SECTION;  Surgeon: Tereso Newcomer, MD;  Location: [facility];  Service: Obstetrics;  Laterality: N/A;   Family History  Problem Relation Age of Onset  . Hypertension Mother   . Diabetes Mother     gestational  . Hypertension Father   . Diabetes Father     developed in his 31's  . Heart disease Father     CAD surgery, "3 open heart surgeries" and pacemaker  . Kidney disease Maternal Uncle   . Kidney disease Maternal Grandmother    Social History  Substance Use Topics  . Smoking  status: Former Games developer  . Smokeless tobacco: Never Used  . Alcohol use 1.8 oz/week    3 Glasses of wine per week     Comment: weekends   Interim medical history since last visit reviewed. Allergies and medications reviewed  Review of Systems Per HPI unless specifically indicated above     Objective:    BP 116/62   Pulse 90   Temp 97.9 F (36.6 C) (Oral)   Resp 14   Wt 192 lb (87.1 kg)   LMP 11/18/2015   SpO2 97%   BMI 34.01 kg/m   Wt Readings from Last 3 Encounters:  11/25/15 192 lb (87.1 kg)  10/26/15 184 lb (83.5 kg)  09/18/15 199 lb (90.3 kg)    Physical Exam  Constitutional: She appears well-developed and well-nourished. No distress.  HENT:  Head: Normocephalic and atraumatic.  Eyes: EOM are normal. No scleral icterus.  Neck: No thyromegaly present.  Cardiovascular: Normal rate, regular rhythm and normal heart sounds.   No murmur heard. Pulmonary/Chest: Effort normal and breath sounds normal.  Abdominal: Soft. Bowel sounds are normal. She exhibits no distension.  Musculoskeletal: Normal range of motion. She exhibits no edema.  Neurological: She is alert. She exhibits normal muscle tone.  Skin: Skin is warm and dry. She is not  diaphoretic. No pallor.  Psychiatric: She has a normal mood and affect.   Results for orders placed or performed during the hospital encounter of 09/18/15  Amnisure rupture of membrane (rom)not at [facility]  Result Value Ref Range   Amnisure ROM POSITIVE   CBC  Result Value Ref Range   WBC 5.7 4.0 - 10.5 K/uL   RBC 4.55 3.87 - 5.11 MIL/uL   Hemoglobin 13.4 12.0 - 15.0 g/dL   HCT 79.1 50.5 - 69.7 %   MCV 83.5 78.0 - 100.0 fL   MCH 29.5 26.0 - 34.0 pg   MCHC 35.3 30.0 - 36.0 g/dL   RDW 94.8 01.6 - 55.3 %   Platelets 246 150 - 400 K/uL  RPR  Result Value Ref Range   RPR Ser Ql Non Reactive Non Reactive  Glucose, capillary  Result Value Ref Range   Glucose-Capillary 121 (H) 65 - 99 mg/dL  Glucose, capillary  Result Value Ref Range    Glucose-Capillary 71 65 - 99 mg/dL  CBC  Result Value Ref Range   WBC 14.0 (H) 4.0 - 10.5 K/uL   RBC 4.13 3.87 - 5.11 MIL/uL   Hemoglobin 11.9 (L) 12.0 - 15.0 g/dL   HCT 74.8 (L) 27.0 - 78.6 %   MCV 84.0 78.0 - 100.0 fL   MCH 28.8 26.0 - 34.0 pg   MCHC 34.3 30.0 - 36.0 g/dL   RDW 75.4 49.2 - 01.0 %   Platelets 222 150 - 400 K/uL  Glucose, capillary  Result Value Ref Range   Glucose-Capillary 117 (H) 65 - 99 mg/dL  Glucose, capillary  Result Value Ref Range   Glucose-Capillary 90 65 - 99 mg/dL  Type and screen [facility]  Result Value Ref Range   ABO/RH(D) O POS    Antibody Screen NEG    Sample Expiration 09/21/2015   ABO/Rh  Result Value Ref Range   ABO/RH(D) O POS       Assessment & Plan:   Problem List Items Addressed This Visit      Other   Obesity    Check lipids today      Relevant Orders   Comprehensive metabolic panel   Lipid panel   Gestational diabetes mellitus (GDM), delivered - Primary    Check glucose and A1c today (last thing to eat or drink was two pieces of toast this morning, so return fasting for labs)      Relevant Orders   Hemoglobin A1c   Comprehensive metabolic panel   Anemia    Mild anemia noted with last labs; check CBC       Other Visit Diagnoses    Abnormal weight gain       Relevant Orders   TSH      Follow up plan: Return in about 6 months (around 05/27/2016) for follow-up and fasting labs.  An after-visit summary was printed and given to the patient at check-out.  Please see the patient instructions which may contain other information and recommendations beyond what is mentioned above in the assessment and plan.  No orders of the defined types were placed in this encounter.   Orders Placed This Encounter  Procedures  . Hemoglobin A1c  . Comprehensive metabolic panel  . Lipid panel  . TSH

## 2015-11-25 NOTE — Assessment & Plan Note (Signed)
Mild anemia noted with last labs; check CBC

## 2015-11-25 NOTE — Patient Instructions (Signed)
Return for fasting labs tomorrow or soon Try to avoid "whites" when you can Stay active, work up to 150 minutes of exercise every week Check out the information at familydoctor.org entitled "Nutrition for Weight Loss: What You Need to Know about Fad Diets" Try to lose between 1-2 pounds per week by taking in fewer calories and burning off more calories You can succeed by limiting portions, limiting foods dense in calories and fat, becoming more active, and drinking 8 glasses of water a day (64 ounces) Don't skip meals, especially breakfast, as skipping meals may alter your metabolism Do not use over-the-counter weight loss pills or gimmicks that claim rapid weight loss A healthy BMI (or body mass index) is between 18.5 and 24.9 You can calculate your ideal BMI at the [employer] website [employer]

## 2015-11-26 ENCOUNTER — Telehealth: Payer: Self-pay | Admitting: Family Medicine

## 2015-11-26 NOTE — Telephone Encounter (Deleted)
I don't see any results (?) Please see if you can talk with him and help him

## 2015-11-26 NOTE — Telephone Encounter (Signed)
I don't have any results (?) Please see if you can help her

## 2015-12-01 NOTE — Telephone Encounter (Signed)
I was confused she has not got done yet

## 2015-12-01 NOTE — Telephone Encounter (Signed)
I still don't see any results; please check on what she needs

## 2016-01-10 ENCOUNTER — Other Ambulatory Visit: Payer: Self-pay | Admitting: Family Medicine

## 2016-01-10 ENCOUNTER — Other Ambulatory Visit: Payer: Self-pay

## 2016-01-10 DIAGNOSIS — D649 Anemia, unspecified: Secondary | ICD-10-CM

## 2016-01-10 DIAGNOSIS — E669 Obesity, unspecified: Secondary | ICD-10-CM

## 2016-01-10 DIAGNOSIS — R635 Abnormal weight gain: Secondary | ICD-10-CM

## 2016-01-10 DIAGNOSIS — O24429 Gestational diabetes mellitus in childbirth, unspecified control: Secondary | ICD-10-CM

## 2016-01-10 LAB — COMPLETE METABOLIC PANEL WITH GFR
ALT: 22 U/L (ref 6–29)
AST: 17 U/L (ref 10–30)
Albumin: 4 g/dL (ref 3.6–5.1)
Alkaline Phosphatase: 88 U/L (ref 33–115)
BUN: 11 mg/dL (ref 7–25)
CHLORIDE: 105 mmol/L (ref 98–110)
CO2: 27 mmol/L (ref 20–31)
CREATININE: 0.59 mg/dL (ref 0.50–1.10)
Calcium: 9.2 mg/dL (ref 8.6–10.2)
GLUCOSE: 103 mg/dL — AB (ref 65–99)
Potassium: 4.7 mmol/L (ref 3.5–5.3)
SODIUM: 139 mmol/L (ref 135–146)
Total Bilirubin: 0.2 mg/dL (ref 0.2–1.2)
Total Protein: 6.9 g/dL (ref 6.1–8.1)

## 2016-01-10 LAB — LIPID PANEL
CHOL/HDL RATIO: 3.6 ratio (ref <=5.0)
CHOLESTEROL: 133 mg/dL (ref 125–200)
HDL: 37 mg/dL — ABNORMAL LOW (ref >=46)
LDL CALC: 82 mg/dL (ref <130)
Triglycerides: 68 mg/dL (ref <150)
VLDL: 14 mg/dL (ref <30)

## 2016-01-10 LAB — TSH: TSH: 0.92 m[IU]/L

## 2016-01-10 LAB — HEMOGLOBIN A1C
Hgb A1c MFr Bld: 5.4 % (ref <5.7)
MEAN PLASMA GLUCOSE: 108 mg/dL

## 2016-01-10 NOTE — Assessment & Plan Note (Signed)
Check cbc 

## 2016-01-18 ENCOUNTER — Encounter: Payer: Self-pay | Admitting: Family Medicine

## 2016-01-18 NOTE — Progress Notes (Signed)
Letter re: labs 

## 2016-08-24 IMAGING — US US MFM OB FOLLOW-UP
1 series · 14 of 19 positions shown · non-contrast
Comparison: none

[Series 1: us mfm ob follow-up · 19 acquisitions, 14 frames shown]
[im 1/19]
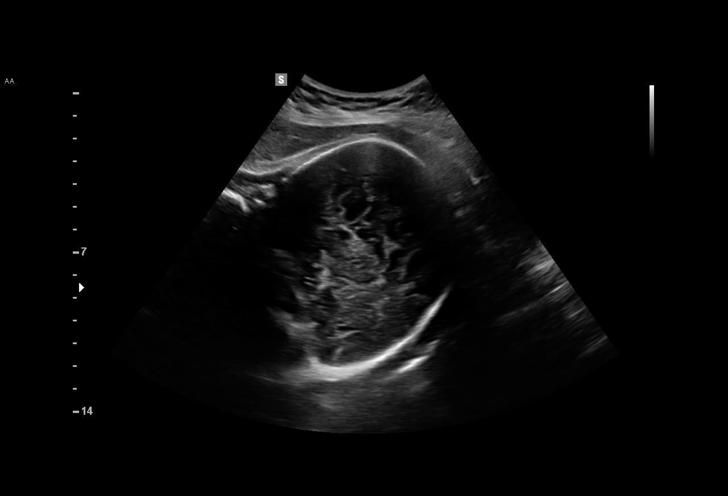
[im 3/19]
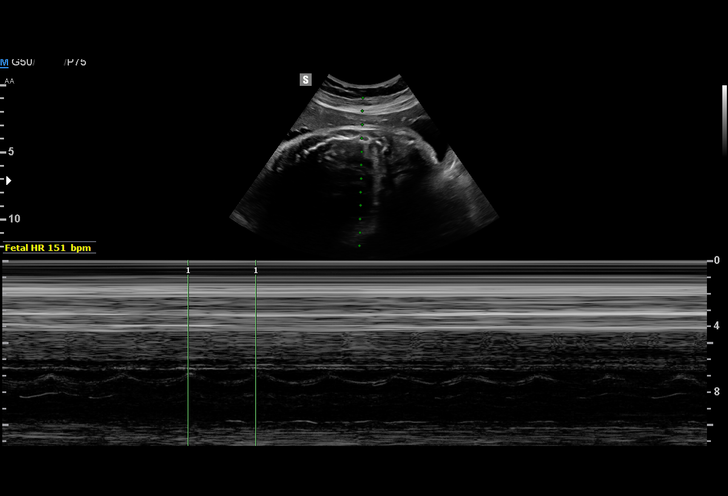
[im 4/19]
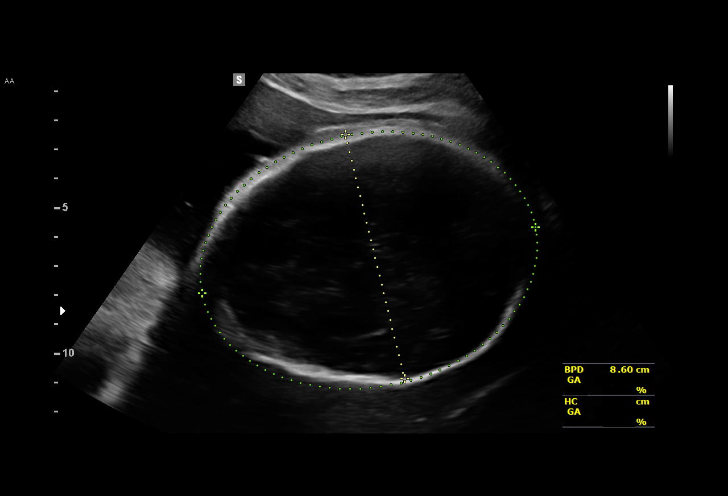
[im 5/19]
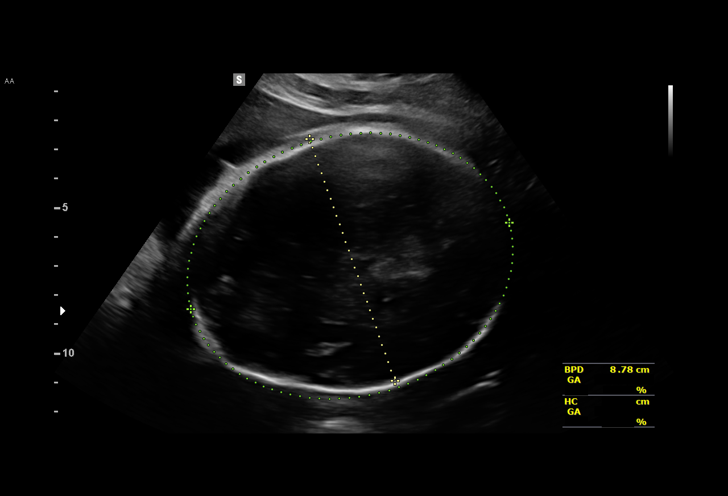
[im 7/19]
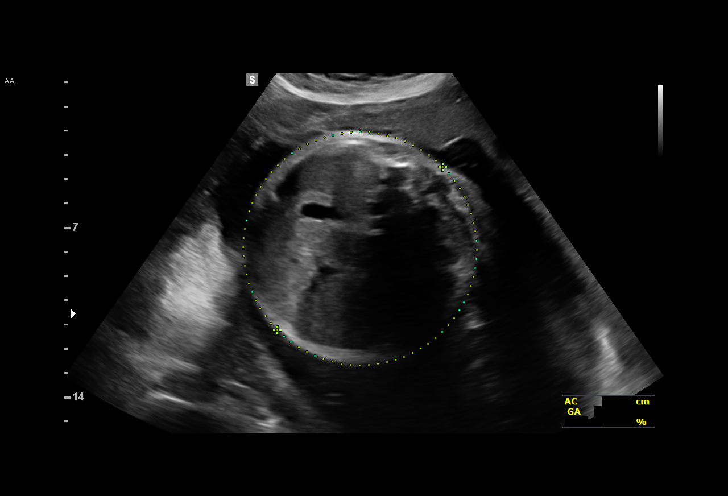
[im 8/19]
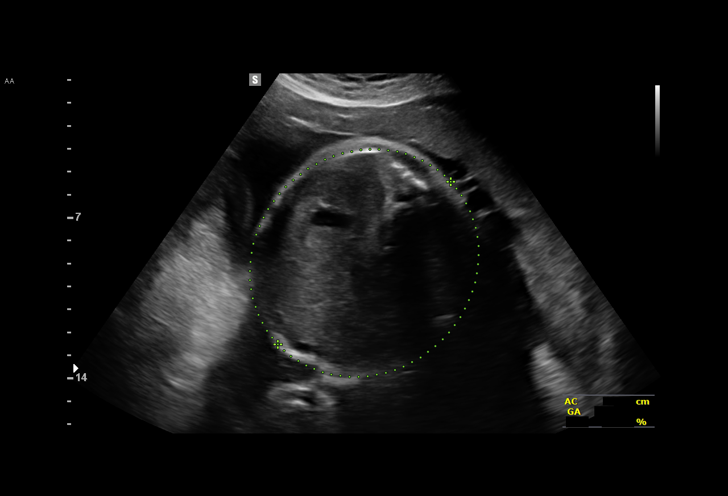
[im 9/19]
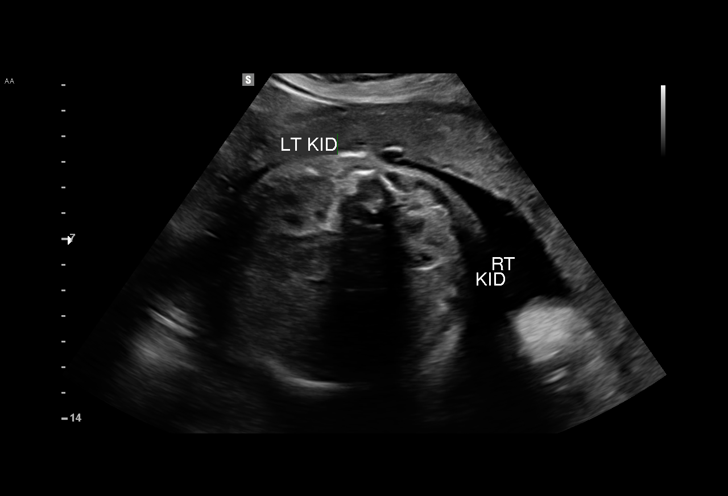
[im 11/19]
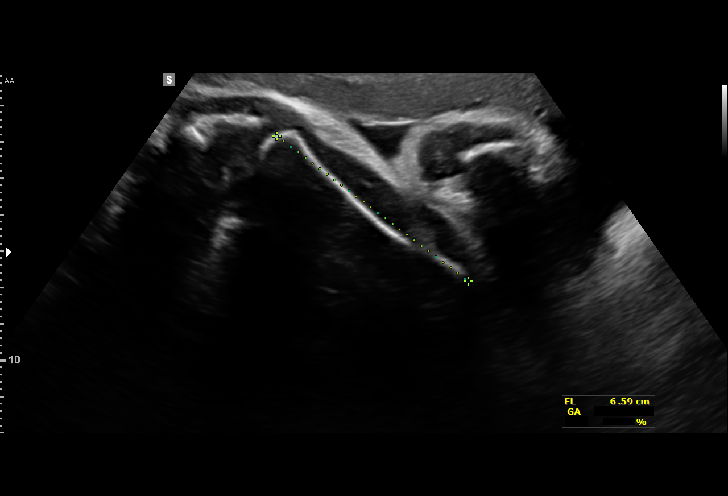
[im 12/19]
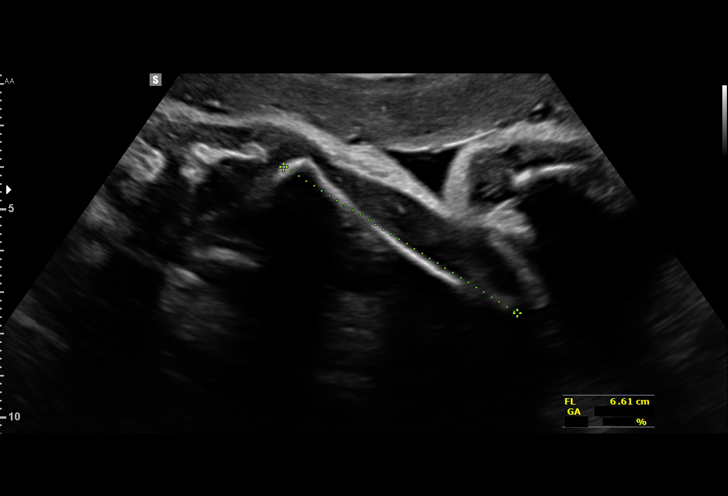
[im 13/19]
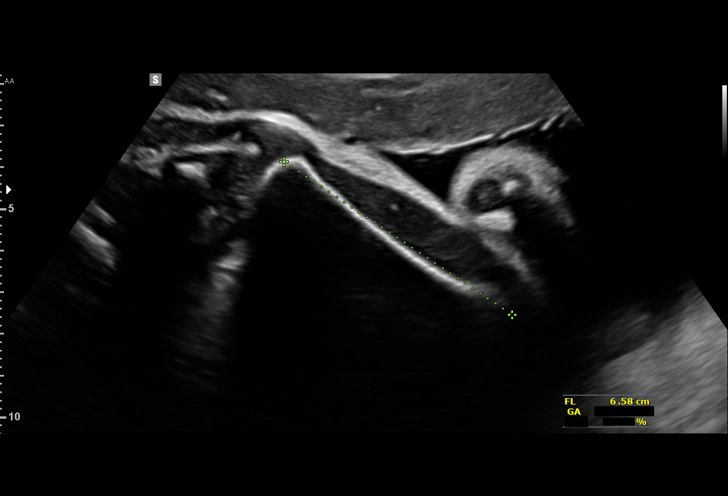
[im 15/19]
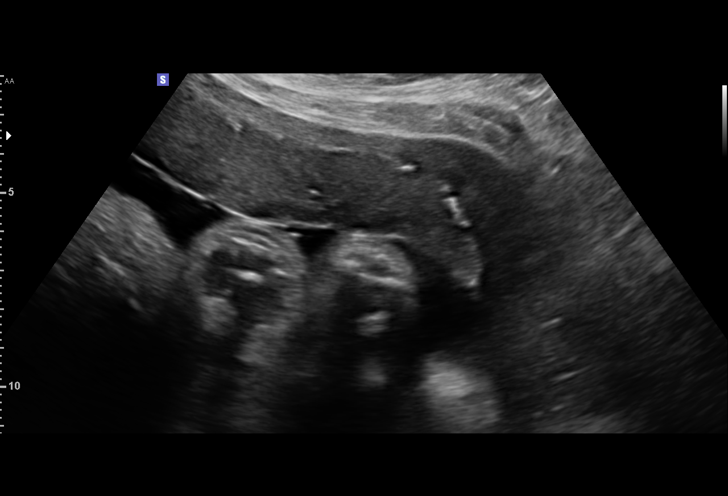
[im 16/19]
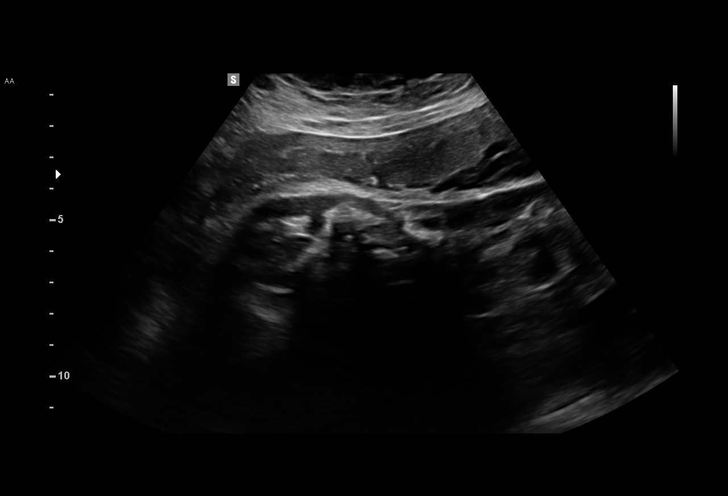
[im 17/19]
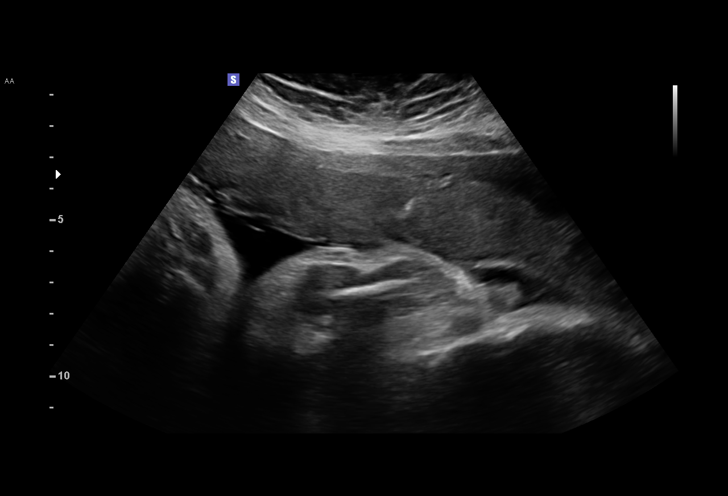
[im 19/19]
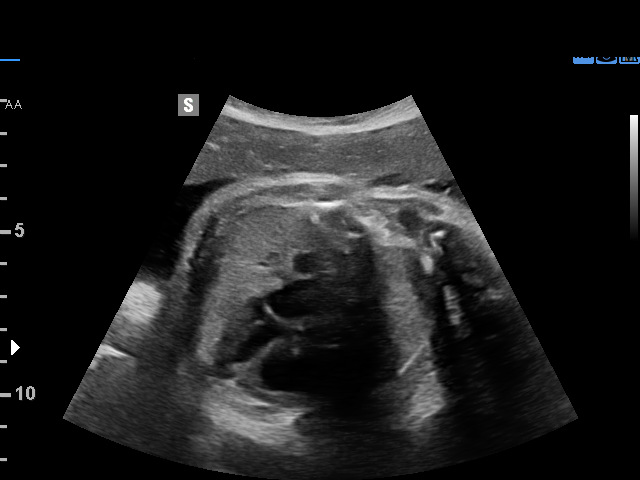

[14 of 19 positions shown; findings below may reference images not displayed]

Attending:        Thale Joranger      Address:          [REDACTED]

[address]            [id number]      [phone]     [id number]
Indications

34 weeks gestation of pregnancy
Advanced maternal age multigravida 39,
third trimester; low risk NIPS
Pre-existing diabetes, type 2, in pregnancy,
third trimester  (on glyburide)
OB History

Gravidity:    2         Term:   0        Prem:   0        SAB:   0
TOP:          1       Ectopic:  0        Living: 0
Fetal Evaluation

Num Of Fetuses:     1
Fetal Heart         151
Rate(bpm):
Cardiac Activity:   Observed
Presentation:       Cephalic
Placenta:           Anterior, above cervical os

Amniotic Fluid
AFI FV:      Subjectively within normal limits
AFI Sum:     17.72   cm       65  %Tile     Larg Pckt:    6.64  cm
RUQ:   6.64    cm   RLQ:    4.34   cm    LUQ:   2.6     cm   LLQ:    4.14   cm
Biometry

BPD:      87.1  mm     G. Age:  35w 1d                  CI:        75.24   %    70 - 86
FL/HC:      20.7   %    19.4 -
HC:      318.5  mm     G. Age:  35w 6d         58  %    HC/AC:      1.03        0.96 -
AC:      307.9  mm     G. Age:  34w 5d         71  %    FL/BPD:     75.7   %    71 - 87
FL:       65.9  mm     G. Age:  34w 0d         35  %    FL/AC:      21.4   %    20 - 24

Est. FW:    9517  gm      5 lb 8 oz     71  %
Gestational Age

LMP:           34w 1d        Date:  12/24/14                 EDD:   09/30/15
U/S Today:     35w 0d                                        EDD:   09/24/15
Best:          34w 1d     Det. By:  LMP  (12/24/14)          EDD:   09/30/15
Anatomy

Cranium:          Appears normal         Aortic Arch:      Previously seen
Fetal Cavum:      Previously seen        Ductal Arch:      Previously seen
Ventricles:       Previously seen        Diaphragm:        Previously seen
Choroid Plexus:   Previously seen        Stomach:          Appears normal, left
sided
Cerebellum:       Previously seen        Abdomen:          Appears normal
Posterior Fossa:  Previously seen        Abdominal Wall:   Previously seen
Nuchal Fold:      Previously seen        Cord Vessels:     Previously seen
Face:             Orbits and profile     Kidneys:          Appear normal
previously seen
Lips:             Previously seen        Bladder:          Previously seen
Fetal Thoracic:   Previously seen        Spine:            Previously seen
Heart:            Previously seen        Upper             Previously seen
Extremities:
RVOT:             Previously seen        Lower             Previously seen
Extremities:
LVOT:             Previously seen

Other:  Fetus appears to be a male. Heels and 5th digit previously visualized.
Nasal bone previously visualized. Technically difficult due to fetal
position.
Cervix Uterus Adnexa

Cervix
Length:            3.9  cm.
Normal appearance by transabdominal scan.
Impression

Single living intrauterine pregnancy at 34 weeks 1 day.
Appropriate interval fetal growth (71%).
Normal amniotic fluid volume.
Normal interval fetal anatomy.
Recommendations

Continue antenatal fetal testing.
No further growth ultrasounds are required prior to delivery.
